# Patient Record
Sex: Female | Born: 1995 | Race: Asian | Hispanic: No | Marital: Single | Smoking: Never smoker
Health system: Southern US, Community
[De-identification: ages and names within clinical notes are randomized; demographics above are authoritative.]

## PROBLEM LIST (undated history)

## (undated) DIAGNOSIS — D649 Anemia, unspecified: Secondary | ICD-10-CM

## (undated) DIAGNOSIS — Z789 Other specified health status: Secondary | ICD-10-CM

## (undated) HISTORY — PX: NO PAST SURGERIES: SHX2092

---
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
## 2018-10-09 NOTE — L&D Delivery Note (Addendum)
Delivery Note At 8:44 AM a viable female was delivered via Vaginal, Vacuum (Extractor) (Presentation: ROA).  APGAR: 7, 9; weight pending   Placenta status: spontaneous and intact .  Cord: 3 vessel cord, cord was short. Anesthesia:  epidural Episiotomy:  Nil required  Lacerations: 2nd degree;Periurethral Suture Repair: 3.0 vicryl Est. Blood Loss (mL):  317  Mom to postpartum.  Baby to Couplet care / Skin to Skin.  Vacuum assisted delivery for fetal intolerance.   Lattie Haw MD PGY-1, [facility] 06/20/2019, 9:18 AM   OB/GYN Faculty Practice Delivery Note  Jacqueline Mitchell is a 23 y.o. G1P0 s/p VAVD at [redacted]w[redacted]d. She was admitted for SROM.   ROM: 31h 66m with clear fluid GBS Status:  --/Positive (08/13 0320) Maximum Maternal Temperature: 98.4 F    Labor Progress: . Patient arrived at 3 cm dilation and was induced with pitocin. She had intermittent late decelerations which required downtitration of pitocin. She achieved full dilation but after 30-40 min of pushing had recurrent prolonged decels and was then allowed to labor down for several hours. She was then found to be +3 station and resumed pushing with good effort but again had prolonged decels necessitating vacuum delivery.   Delivery Date/Time: 06/20/2019 at 0844 Delivery: Called to room and patient was complete and pushing but having prolonged decelerations. Dr. Ilda Basset called to the room, and after obtaining verbal consent Kiwi vacuum applied to infants head. Head delivered with one pull in ROA position. No nuchal cord present. Shoulder and body delivered in usual fashion. Infant with spontaneous cry, placed on mother's abdomen, dried and stimulated. Cord clamped x 2 after 1-minute delay, and cut by Dr. Freddi Starr. Cord blood drawn. Placenta delivered spontaneously with gentle cord traction. Fundus firm with massage and Pitocin. Labia, perineum, vagina, and cervix inspected with hemostatic bilateral periuretheral lacs that were  not repaired and a R shallow 2nd degree perineal and labial laceration that was repaired with two interrupted stitches.   Placenta: 3v, intact Complications: none Lacerations: bilateral periuretheral not repaired; R 2nd degree perineal and labial, repaired EBL: 317 Analgesia: epidural   Infant: APGAR (1 MIN): 7   APGAR (5 MINS): 9    Weight: pending  Augustin Coupe, MD/[facility] OB/GYN Fellow, Faculty Practice

## 2018-12-10 DIAGNOSIS — Z3400 Encounter for supervision of normal first pregnancy, unspecified trimester: Secondary | ICD-10-CM | POA: Insufficient documentation

## 2018-12-11 ENCOUNTER — Encounter: Payer: Self-pay | Admitting: Obstetrics and Gynecology

## 2018-12-26 ENCOUNTER — Other Ambulatory Visit: Payer: Self-pay

## 2018-12-26 ENCOUNTER — Other Ambulatory Visit (HOSPITAL_COMMUNITY)
Admission: RE | Admit: 2018-12-26 | Discharge: 2018-12-26 | Disposition: A | Discharge status: Home / Self Care | Payer: Medicaid Other | Source: Ambulatory Visit | Attending: Obstetrics and Gynecology | Admitting: Obstetrics and Gynecology

## 2018-12-26 ENCOUNTER — Encounter: Payer: Self-pay | Admitting: Certified Nurse Midwife

## 2018-12-26 ENCOUNTER — Ambulatory Visit (INDEPENDENT_AMBULATORY_CARE_PROVIDER_SITE_OTHER): Payer: Medicaid Other | Admitting: Certified Nurse Midwife

## 2018-12-26 VITALS — BP 107/66 | HR 98 | Ht 62.0 in | Wt 123.8 lb

## 2018-12-26 DIAGNOSIS — Z3400 Encounter for supervision of normal first pregnancy, unspecified trimester: Secondary | ICD-10-CM | POA: Insufficient documentation

## 2018-12-26 DIAGNOSIS — Z23 Encounter for immunization: Secondary | ICD-10-CM

## 2018-12-26 DIAGNOSIS — Z3481 Encounter for supervision of other normal pregnancy, first trimester: Secondary | ICD-10-CM | POA: Diagnosis not present

## 2018-12-26 DIAGNOSIS — Z3A20 20 weeks gestation of pregnancy: Secondary | ICD-10-CM

## 2018-12-26 DIAGNOSIS — O0932 Supervision of pregnancy with insufficient antenatal care, second trimester: Secondary | ICD-10-CM

## 2018-12-26 MED ORDER — VITAFOL GUMMIES 3.33-0.333-34.8 MG PO CHEW: 3.0000 | CHEWABLE_TABLET | Freq: Every day | ORAL | 3 refills | Status: DC

## 2018-12-26 NOTE — Progress Notes (Signed)
History:   Jacqueline Mitchell is a 23 y.o. G1P0 at [redacted]w[redacted]d by LMP being seen today for her first obstetrical visit.  Her obstetrical history is significant for late to prenatal care. Patient does intend to breast feed. Pregnancy history fully reviewed.  Patient reports no complaints.  HISTORY: OB History  Gravida Para Term Preterm AB Living  1 0 0 0 0 0  SAB TAB Ectopic Multiple Live Births  0 0 0 0 0    # Outcome Date GA Lbr Len/2nd Weight Sex Delivery Anes PTL Lv  1 Current            History reviewed. No pertinent past medical history. History reviewed. No pertinent surgical history. History reviewed. No pertinent family history. Social History   Tobacco Use  . Smoking status: Not on file  Substance Use Topics  . Alcohol use: Not on file  . Drug use: Not on file   No Known Allergies No current outpatient medications on file prior to visit.   No current facility-administered medications on file prior to visit.     Review of Systems Pertinent items noted in HPI and remainder of comprehensive ROS otherwise negative. Physical Exam:   Vitals:   12/26/18 1037 12/26/18 1043  BP: 107/66   Pulse: 98   Weight: 123 lb 12.8 oz (56.2 kg)   Height:  5\' 2"  (1.575 m)   Fetal Heart Rate (bpm): 156 Pelvic Exam: Perineum: no hemorrhoids, normal perineum   Vulva: normal external genitalia, no lesions   Bony Pelvis: average  System: General: well-developed, well-nourished female in no acute distress   Skin: normal coloration and turgor, no rashes   Neurologic: oriented, normal, negative, normal mood   Extremities: normal strength, tone, and muscle mass, ROM of all joints is normal   HEENT PERRLA, extraocular movement intact and sclera clear, anicteric   Mouth/Teeth mucous membranes moist, pharynx normal without lesions and dental hygiene good   Neck supple and no masses   Cardiovascular: regular rate and rhythm   Respiratory:  no respiratory distress, normal breath sounds   Abdomen:  soft, non-tender; bowel sounds normal; no masses,  no organomegaly   Assessment:    Pregnancy: G1P0 Patient Active Problem List   Diagnosis Date Noted  . Late prenatal care affecting pregnancy in second trimester 12/26/2018  . Supervision of normal first pregnancy 12/10/2018     Plan:    1. Supervision of normal first pregnancy, antepartum - Routine prenatal care  - Anticipatory guidance on upcoming appointments  - Reports family hx of diabetes  - Cervicovaginal ancillary only( [place]) - Genetic Screening - Culture, OB Urine - Obstetric Panel, Including HIV - AFP, Serum, Open Spina Bifida - [name] MFM OB COMP + 14 WK; Future - HgB A1c - Prenatal Vit-Fe Phos-FA-Omega (VITAFOL GUMMIES) 3.33-0.333-34.8 MG CHEW; Chew 3 tablets by mouth daily.  Dispense: 90 tablet; Refill: 3  2. Late prenatal care affecting pregnancy in second trimester - Care initiated after 20 weeks  - [name] MFM OB COMP + 14 WK; Future  3. Need for immunization against influenza - Flu Vaccine QUAD 36+ mos IM   Initial labs drawn. Rx for prenatal vitamins sent to pharmacy on file  Genetic Screening discussed, NIPS: ordered. Ultrasound discussed; fetal anatomic survey: ordered. Problem list reviewed and updated. The nature of [facility] with multiple MDs and other Advanced Practice Providers was explained to patient; also emphasized that residents, students are part of our team.  Routine obstetric precautions reviewed. Return in about 4 weeks (around 01/23/2019) for [name].     Sharyon Cable, CNM [facility] [employer], [facility]

## 2018-12-26 NOTE — Patient Instructions (Addendum)
Second Trimester of Pregnancy  The second trimester is from week 14 through week 27 (month 4 through 6). This is often the time in pregnancy that you feel your best. Often times, morning sickness has lessened or quit. You may have more energy, and you may get hungry more often. Your unborn baby is growing rapidly. At the end of the sixth month, he or she is about 9 inches long and weighs about 1 pounds. You will likely feel the baby move between 18 and 20 weeks of pregnancy. Follow these instructions at home: Medicines  Take over-the-counter and prescription medicines only as told by your doctor. Some medicines are safe and some medicines are not safe during pregnancy.  Take a prenatal vitamin that contains at least 600 micrograms (mcg) of folic acid.  If you have trouble pooping (constipation), take medicine that will make your stool soft (stool softener) if your doctor approves. Eating and drinking   Eat regular, healthy meals.  Avoid raw meat and uncooked cheese.  If you get low calcium from the food you eat, talk to your doctor about taking a daily calcium supplement.  Avoid foods that are high in fat and sugars, such as fried and sweet foods.  If you feel sick to your stomach (nauseous) or throw up (vomit): ? Eat 4 or 5 small meals a day instead of 3 large meals. ? Try eating a few soda crackers. ? Drink liquids between meals instead of during meals.  To prevent constipation: ? Eat foods that are high in fiber, like fresh fruits and vegetables, whole grains, and beans. ? Drink enough fluids to keep your pee (urine) clear or pale yellow. Activity  Exercise only as told by your doctor. Stop exercising if you start to have cramps.  Do not exercise if it is too hot, too humid, or if you are in a place of great height (high altitude).  Avoid heavy lifting.  Wear low-heeled shoes. Sit and stand up straight.  You can continue to have sex unless your doctor tells you not to.  Relieving pain and discomfort  Wear a good support bra if your breasts are tender.  Take warm water baths (sitz baths) to soothe pain or discomfort caused by hemorrhoids. Use hemorrhoid cream if your doctor approves.  Rest with your legs raised if you have leg cramps or low back pain.  If you develop puffy, bulging veins (varicose veins) in your legs: ? Wear support hose or compression stockings as told by your doctor. ? Raise (elevate) your feet for 15 minutes, 3-4 times a day. ? Limit salt in your food. Prenatal care  Write down your questions. Take them to your prenatal visits.  Keep all your prenatal visits as told by your doctor. This is important. Safety  Wear your seat belt when driving.  Make a list of emergency phone numbers, including numbers for family, friends, the hospital, and police and fire departments. General instructions  Ask your doctor about the right foods to eat or for help finding a counselor, if you need these services.  Ask your doctor about local prenatal classes. Begin classes before month 6 of your pregnancy.  Do not use hot tubs, steam rooms, or saunas.  Do not douche or use tampons or scented sanitary pads.  Do not cross your legs for long periods of time.  Visit your dentist if you have not done so. Use a soft toothbrush to brush your teeth. Floss gently.  Avoid all smoking, herbs,   and alcohol. Avoid drugs that are not approved by your doctor.  Do not use any products that contain nicotine or tobacco, such as cigarettes and e-cigarettes. If you need help quitting, ask your doctor.  Avoid cat litter boxes and soil used by cats. These carry germs that can cause birth defects in the baby and can cause a loss of your baby (miscarriage) or stillbirth. Contact a doctor if:  You have mild cramps or pressure in your lower belly.  You have pain when you pee (urinate).  You have bad smelling fluid coming from your vagina.  You continue to feel  sick to your stomach (nauseous), throw up (vomit), or have watery poop (diarrhea).  You have a nagging pain in your belly area.  You feel dizzy. Get help right away if:  You have a fever.  You are leaking fluid from your vagina.  You have spotting or bleeding from your vagina.  You have severe belly cramping or pain.  You lose or gain weight rapidly.  You have trouble catching your breath and have chest pain.  You notice sudden or extreme puffiness (swelling) of your face, hands, ankles, feet, or legs.  You have not felt the baby move in over an hour.  You have severe headaches that do not go away when you take medicine.  You have trouble seeing. Summary  The second trimester is from week 14 through week 27 (months 4 through 6). This is often the time in pregnancy that you feel your best.  To take care of yourself and your unborn baby, you will need to eat healthy meals, take medicines only if your doctor tells you to do so, and do activities that are safe for you and your baby.  Call your doctor if you get sick or if you notice anything unusual about your pregnancy. Also, call your doctor if you need help with the right food to eat, or if you want to know what activities are safe for you. This information is not intended to replace advice given to you by your health care provider. Make sure you discuss any questions you have with your health care provider. Document Released: 12/20/2009 Document Revised: 10/31/2016 Document Reviewed: 10/31/2016 Elsevier Interactive Patient Education  2019 [employer] Inc.  Influenza (Flu) Vaccine (Inactivated or Recombinant): What You Need to Know  1. Why get vaccinated? Influenza vaccine can prevent influenza (flu). Flu is a contagious disease that spreads around the [place] every year, usually between October and May. Anyone can get the flu, but it is more dangerous for some people. Infants and young children, people 41 years of age and  older, pregnant women, and people with certain health conditions or a weakened immune system are at greatest risk of flu complications. Pneumonia, bronchitis, sinus infections and ear infections are examples of flu-related complications. If you have a medical condition, such as heart disease, cancer or diabetes, flu can make it worse. Flu can cause fever and chills, sore throat, muscle aches, fatigue, cough, headache, and runny or stuffy nose. Some people may have vomiting and diarrhea, though this is more common in children than adults. Each year thousands of people in the [place] die from flu, and many more are hospitalized. Flu vaccine prevents millions of illnesses and flu-related visits to the doctor each year. 2. Influenza vaccine CDC recommends everyone 93 months of age and older get vaccinated every flu season. Children 6 months through 62 years of age may need 2 doses during  a single flu season. Everyone else needs only 1 dose each flu season. It takes about 2 weeks for protection to develop after vaccination. There are many flu viruses, and they are always changing. Each year a new flu vaccine is made to protect against three or four viruses that are likely to cause disease in the upcoming flu season. Even when the vaccine doesn't exactly match these viruses, it may still provide some protection. Influenza vaccine does not cause flu. Influenza vaccine may be given at the same time as other vaccines. 3. Talk with your health care provider Tell your vaccine provider if the person getting the vaccine:  Has had an allergic reaction after a previous dose of influenza vaccine, or has any severe, life-threatening allergies.  Has ever had Guillain-Barr Syndrome (also called GBS). In some cases, your health care provider may decide to postpone influenza vaccination to a future visit. People with minor illnesses, such as a cold, may be vaccinated. People who are moderately or severely ill  should usually wait until they recover before getting influenza vaccine. Your health care provider can give you more information. 4. Risks of a vaccine reaction  Soreness, redness, and swelling where shot is given, fever, muscle aches, and headache can happen after influenza vaccine.  There may be a very small increased risk of Guillain-Barr Syndrome (GBS) after inactivated influenza vaccine (the flu shot). Young children who get the flu shot along with pneumococcal vaccine (PCV13), and/or DTaP vaccine at the same time might be slightly more likely to have a seizure caused by fever. Tell your health care provider if a child who is getting flu vaccine has ever had a seizure. People sometimes faint after medical procedures, including vaccination. Tell your provider if you feel dizzy or have vision changes or ringing in the ears. As with any medicine, there is a very remote chance of a vaccine causing a severe allergic reaction, other serious injury, or death. 5. What if there is a serious problem? An allergic reaction could occur after the vaccinated person leaves the clinic. If you see signs of a severe allergic reaction (hives, swelling of the face and throat, difficulty breathing, a fast heartbeat, dizziness, or weakness), call 9-1-1 and get the person to the nearest hospital. For other signs that concern you, call your health care provider. Adverse reactions should be reported to the Vaccine Adverse Event Reporting System (VAERS). Your health care provider will usually file this report, or you can do it yourself. Visit the VAERS website at www.vaers.LAgents.no or call 878-578-4967.VAERS is only for reporting reactions, and VAERS staff do not give medical advice. 6. The National Vaccine Injury Compensation Program The [employer] Vaccine Injury Compensation Program (VICP) is a federal program that was created to compensate people who may have been injured by certain vaccines. Visit the VICP website at  SpiritualWord.at or call 201-485-1299 to learn about the program and about filing a claim. There is a time limit to file a claim for compensation. 7. How can I learn more?  Ask your healthcare provider.  Call your local or state health department.  Contact the Centers for Disease Control and Prevention (CDC): ? Call 351-153-2179 (1-800-CDC-INFO) or ? Visit CDC's BiotechRoom.com.cy Vaccine Information Statement (Interim) Inactivated Influenza Vaccine (05/23/2018) This information is not intended to replace advice given to you by your health care provider. Make sure you discuss any questions you have with your health care provider. Document Released: 07/20/2006 Document Revised: 05/27/2018 Document Reviewed: 05/27/2018 Elsevier Interactive Patient Education  2019 [place].

## 2018-12-28 LAB — OBSTETRIC PANEL, INCLUDING HIV
Antibody Screen: NEGATIVE
Basophils Absolute: 0 10*3/uL (ref 0.0–0.2)
Basos: 0 %
EOS (ABSOLUTE): 0.1 10*3/uL (ref 0.0–0.4)
Eos: 1 %
HIV Screen 4th Generation wRfx: NONREACTIVE
Hematocrit: 31.6 % — ABNORMAL LOW (ref 34.0–46.6)
Hemoglobin: 10.3 g/dL — ABNORMAL LOW (ref 11.1–15.9)
Hepatitis B Surface Ag: NEGATIVE
Immature Grans (Abs): 0.1 10*3/uL (ref 0.0–0.1)
Immature Granulocytes: 1 %
Lymphocytes Absolute: 1.6 10*3/uL (ref 0.7–3.1)
Lymphs: 12 %
MCH: 28.6 pg (ref 26.6–33.0)
MCHC: 32.6 g/dL (ref 31.5–35.7)
MCV: 88 fL (ref 79–97)
Monocytes Absolute: 0.9 10*3/uL (ref 0.1–0.9)
Monocytes: 6 %
Neutrophils Absolute: 10.9 10*3/uL — ABNORMAL HIGH (ref 1.4–7.0)
Neutrophils: 80 %
Platelets: 284 10*3/uL (ref 150–450)
RBC: 3.6 x10E6/uL — ABNORMAL LOW (ref 3.77–5.28)
RDW: 15.3 % (ref 11.7–15.4)
RPR Ser Ql: NONREACTIVE
Rh Factor: POSITIVE
Rubella Antibodies, IGG: 5.36 index (ref >0.99)
WBC: 13.7 10*3/uL — ABNORMAL HIGH (ref 3.4–10.8)

## 2018-12-28 LAB — AFP, SERUM, OPEN SPINA BIFIDA
AFP MoM: 0.59
AFP Value: 40.4 ng/mL
Gest. Age on Collection Date: 20.5 weeks
Maternal Age At EDD: 21.1 yr
OSBR Risk 1 IN: 10000
Test Results:: NEGATIVE
Weight: 123 [lb_av]

## 2018-12-28 LAB — CERVICOVAGINAL ANCILLARY ONLY
Chlamydia: NEGATIVE
Neisseria Gonorrhea: NEGATIVE
Trichomonas: NEGATIVE

## 2018-12-28 LAB — HEMOGLOBIN A1C
Est. average glucose Bld gHb Est-mCnc: 103 mg/dL
Hgb A1c MFr Bld: 5.2 % (ref 4.8–5.6)

## 2018-12-29 LAB — CULTURE, OB URINE

## 2018-12-29 LAB — URINE CULTURE, OB REFLEX

## 2018-12-30 ENCOUNTER — Ambulatory Visit (HOSPITAL_COMMUNITY)
Admission: RE | Admit: 2018-12-30 | Discharge: 2018-12-30 | Disposition: A | Discharge status: Home / Self Care | Payer: Medicaid Other | Source: Ambulatory Visit | Attending: Certified Nurse Midwife | Admitting: Certified Nurse Midwife

## 2018-12-30 ENCOUNTER — Other Ambulatory Visit: Payer: Self-pay

## 2018-12-30 DIAGNOSIS — Z3400 Encounter for supervision of normal first pregnancy, unspecified trimester: Secondary | ICD-10-CM

## 2018-12-30 DIAGNOSIS — O26842 Uterine size-date discrepancy, second trimester: Secondary | ICD-10-CM | POA: Diagnosis not present

## 2018-12-30 DIAGNOSIS — Z3A16 16 weeks gestation of pregnancy: Secondary | ICD-10-CM

## 2018-12-30 DIAGNOSIS — Z363 Encounter for antenatal screening for malformations: Secondary | ICD-10-CM

## 2018-12-30 DIAGNOSIS — O0932 Supervision of pregnancy with insufficient antenatal care, second trimester: Secondary | ICD-10-CM | POA: Diagnosis not present

## 2019-01-06 ENCOUNTER — Encounter: Payer: Self-pay | Admitting: Certified Nurse Midwife

## 2019-01-09 ENCOUNTER — Encounter: Payer: Medicaid Other | Admitting: Certified Nurse Midwife

## 2019-01-21 ENCOUNTER — Other Ambulatory Visit (HOSPITAL_COMMUNITY): Payer: Self-pay | Admitting: *Deleted

## 2019-01-21 DIAGNOSIS — Z362 Encounter for other antenatal screening follow-up: Secondary | ICD-10-CM

## 2019-01-23 ENCOUNTER — Other Ambulatory Visit: Payer: Self-pay

## 2019-01-23 ENCOUNTER — Ambulatory Visit (INDEPENDENT_AMBULATORY_CARE_PROVIDER_SITE_OTHER): Payer: Medicaid Other | Admitting: Family Medicine

## 2019-01-23 DIAGNOSIS — Z3402 Encounter for supervision of normal first pregnancy, second trimester: Secondary | ICD-10-CM | POA: Diagnosis not present

## 2019-01-23 DIAGNOSIS — Z3A19 19 weeks gestation of pregnancy: Secondary | ICD-10-CM

## 2019-01-23 NOTE — Progress Notes (Signed)
   TELEHEALTH VIRTUAL OBSTETRICS VISIT ENCOUNTER NOTE  I connected with Jacqueline Mitchell on 01/23/19 at 10:55 AM EDT by telephone at home and verified that I am speaking with the correct person using two identifiers.   I discussed the limitations, risks, security and privacy concerns of performing an evaluation and management service by telephone and the availability of in person appointments. I also discussed with the patient that there may be a patient responsible charge related to this service. The patient expressed understanding and agreed to proceed.  Subjective:  Jacqueline Mitchell is a 23 y.o. G1P0 at [redacted]w[redacted]d being followed for ongoing prenatal care.  She is currently monitored for the following issues for this low-risk pregnancy and has Supervision of normal first pregnancy and Late prenatal care affecting pregnancy in second trimester on their problem list.  Patient reports LLQ abdominal pain with movement. Reports fetal movement. Denies any contractions, bleeding or leaking of fluid.   The following portions of the patient's history were reviewed and updated as appropriate: allergies, current medications, past family history, past medical history, past social history, past surgical history and problem list.   Objective:   General:  Alert, oriented and cooperative.   Mental Status: Normal mood and affect perceived. Normal judgment and thought content.  Rest of physical exam deferred due to type of encounter  Assessment and Plan:  Pregnancy: G1P0 at [redacted]w[redacted]d 1. Encounter for supervision of normal first pregnancy in second trimester Normal Round ligament pain Anatomy f/u scheduled for 1st week of May Advised she should start to feel baby move in next 1-2 wks - Babyscripts Schedule Optimization  General obstetric precautions including but not limited to vaginal bleeding, contractions, leaking of fluid and fetal movement were reviewed in detail with the patient.  I discussed the assessment and treatment  plan with the patient. The patient was provided an opportunity to ask questions and all were answered. The patient agreed with the plan and demonstrated an understanding of the instructions. The patient was advised to call back or seek an in-person office evaluation/go to MAU at [facility] for any urgent or concerning symptoms. Please refer to After Visit Summary for other counseling recommendations.   I provided 6 minutes of non-face-to-face time during this encounter.  Return in about 4 weeks (around 02/20/2019) for [facility] virtual, next in person in 8 wks with 28 wk labs.  Future Appointments  Date Time Provider Department Center  02/10/2019 11:30 AM WH-MFC [name] 1 WH-MFCUS MFC-US    Reva Bores, MD Center for [facility], [facility]

## 2019-02-10 ENCOUNTER — Other Ambulatory Visit: Payer: Self-pay

## 2019-02-10 ENCOUNTER — Ambulatory Visit (HOSPITAL_COMMUNITY)
Admission: RE | Admit: 2019-02-10 | Discharge: 2019-02-10 | Disposition: A | Discharge status: Home / Self Care | Payer: Medicaid Other | Source: Ambulatory Visit | Attending: Obstetrics and Gynecology | Admitting: Obstetrics and Gynecology

## 2019-02-10 DIAGNOSIS — O0932 Supervision of pregnancy with insufficient antenatal care, second trimester: Secondary | ICD-10-CM | POA: Diagnosis not present

## 2019-02-10 DIAGNOSIS — Z3A22 22 weeks gestation of pregnancy: Secondary | ICD-10-CM

## 2019-02-10 DIAGNOSIS — Z362 Encounter for other antenatal screening follow-up: Secondary | ICD-10-CM

## 2019-02-20 ENCOUNTER — Encounter: Payer: Medicaid Other | Admitting: Obstetrics

## 2019-02-26 ENCOUNTER — Telehealth: Payer: Self-pay | Admitting: Obstetrics

## 2019-03-05 ENCOUNTER — Other Ambulatory Visit: Payer: Self-pay

## 2019-03-05 ENCOUNTER — Encounter: Payer: Self-pay | Admitting: Obstetrics

## 2019-03-05 ENCOUNTER — Ambulatory Visit (INDEPENDENT_AMBULATORY_CARE_PROVIDER_SITE_OTHER): Payer: Medicaid Other | Admitting: Obstetrics

## 2019-03-05 VITALS — BP 107/58 | HR 103

## 2019-03-05 DIAGNOSIS — Z3A25 25 weeks gestation of pregnancy: Secondary | ICD-10-CM | POA: Diagnosis not present

## 2019-03-05 DIAGNOSIS — Z3402 Encounter for supervision of normal first pregnancy, second trimester: Secondary | ICD-10-CM | POA: Diagnosis not present

## 2019-03-05 DIAGNOSIS — Z3400 Encounter for supervision of normal first pregnancy, unspecified trimester: Secondary | ICD-10-CM

## 2019-03-05 NOTE — Progress Notes (Signed)
   TELEHEALTH VIRTUAL OBSTETRICS VISIT ENCOUNTER NOTE  I connected with Jacqueline Mitchell on 03/05/19 at 10:00 AM EDT by telephone at home and verified that I am speaking with the correct person using two identifiers.   I discussed the limitations, risks, security and privacy concerns of performing an evaluation and management service by telephone and the availability of in person appointments. I also discussed with the patient that there may be a patient responsible charge related to this service. The patient expressed understanding and agreed to proceed.  Subjective:  Jacqueline Mitchell is a 23 y.o. G1P0 at [redacted]w[redacted]d being followed for ongoing prenatal care.  She is currently monitored for the following issues for this low-risk pregnancy and has Supervision of normal first pregnancy and Late prenatal care affecting pregnancy in second trimester on their problem list.  Patient reports backache and heartburn. Reports fetal movement. Denies any contractions, bleeding or leaking of fluid.   The following portions of the patient's history were reviewed and updated as appropriate: allergies, current medications, past family history, past medical history, past social history, past surgical history and problem list.   Objective:   General:  Alert, oriented and cooperative.   Mental Status: Normal mood and affect perceived. Normal judgment and thought content.  Rest of physical exam deferred due to type of encounter  Assessment and Plan:  Pregnancy: G1P0 at [redacted]w[redacted]d 1. Supervision of normal first pregnancy, antepartum   Preterm labor symptoms and general obstetric precautions including but not limited to vaginal bleeding, contractions, leaking of fluid and fetal movement were reviewed in detail with the patient.  I discussed the assessment and treatment plan with the patient. The patient was provided an opportunity to ask questions and all were answered. The patient agreed with the plan and demonstrated an understanding of  the instructions. The patient was advised to call back or seek an in-person office evaluation/go to MAU at [facility] for any urgent or concerning symptoms. Please refer to After Visit Summary for other counseling recommendations.   I provided 10 minutes of non-face-to-face time during this encounter.  Return in about 3 weeks (around 03/26/2019) for [name], 2 hour OGTT.  Future Appointments  Date Time Provider Department Center  03/05/2019 10:00 AM Brock Bad, MD [facility] None  03/20/2019  8:30 AM [facility] LAB [facility] None  03/20/2019  8:55 AM Calvert Cantor, CNM [facility] None    Coral Ceo, MD Center for [facility], [facility] 03-05-2019

## 2019-03-05 NOTE — Progress Notes (Signed)
Pt is on the phone preparing for virtual visit with provider. [redacted]w[redacted]d.

## 2019-03-20 ENCOUNTER — Ambulatory Visit (INDEPENDENT_AMBULATORY_CARE_PROVIDER_SITE_OTHER): Payer: Medicaid Other | Admitting: Advanced Practice Midwife

## 2019-03-20 ENCOUNTER — Encounter: Payer: Self-pay | Admitting: Advanced Practice Midwife

## 2019-03-20 ENCOUNTER — Other Ambulatory Visit: Payer: Self-pay

## 2019-03-20 ENCOUNTER — Other Ambulatory Visit: Payer: Medicaid Other

## 2019-03-20 VITALS — BP 107/67 | HR 98 | Wt 138.6 lb

## 2019-03-20 DIAGNOSIS — Z23 Encounter for immunization: Secondary | ICD-10-CM | POA: Diagnosis not present

## 2019-03-20 DIAGNOSIS — Z3400 Encounter for supervision of normal first pregnancy, unspecified trimester: Secondary | ICD-10-CM

## 2019-03-20 DIAGNOSIS — Z3402 Encounter for supervision of normal first pregnancy, second trimester: Secondary | ICD-10-CM

## 2019-03-20 DIAGNOSIS — Z3A27 27 weeks gestation of pregnancy: Secondary | ICD-10-CM

## 2019-03-20 NOTE — Progress Notes (Signed)
   PRENATAL VISIT NOTE  Subjective:  Jacqueline Mitchell is a 23 y.o. G1P0 at [redacted]w[redacted]d being seen today for ongoing prenatal care.  She is currently monitored for the following issues for this low-risk pregnancy and has Supervision of normal first pregnancy and Late prenatal care affecting pregnancy in second trimester on their problem list.  Patient reports no complaints.  Contractions: Not present. Vag. Bleeding: None.  Movement: Present. Denies leaking of fluid.   The following portions of the patient's history were reviewed and updated as appropriate: allergies, current medications, past family history, past medical history, past social history, past surgical history and problem list. Problem list updated.  Objective:   Vitals:   03/20/19 0847  BP: 107/67  Pulse: 98  Weight: 138 lb 9.6 oz (62.9 kg)    Fetal Status: Fetal Heart Rate (bpm): 150   Movement: Present   FH 27cm  General:  Alert, oriented and cooperative. Patient is in no acute distress.  Skin: Skin is warm and dry. No rash noted.   Cardiovascular: Normal heart rate noted  Respiratory: Normal respiratory effort, no problems with respiration noted  Abdomen: Soft, gravid, appropriate for gestational age.  Pain/Pressure: Present     Pelvic: Cervical exam deferred        Extremities: Normal range of motion.  Edema: None  Mental Status: Normal mood and affect. Normal behavior. Normal judgment and thought content.   Assessment and Plan:  Pregnancy: G1P0 at [redacted]w[redacted]d  1. Supervision of normal first pregnancy, antepartum --No complaints or concerns, continue routine care --No home blood pressures in Baby Scripts since 05/20. Instructed patient to please obtain weekly --Reviewed third trimester typical symptoms, precautions --Discussed kick counts beginning at 28 weeks --Ensured patient is aware of location of MAU and symptoms which would merit evaluation there --TDAP today, problem list updated  Preterm labor symptoms and general  obstetric precautions including but not limited to vaginal bleeding, contractions, leaking of fluid and fetal movement were reviewed in detail with the patient.  Please refer to After Visit Summary for other counseling recommendations.  Return in about 4 weeks (around 04/17/2019) for televisit with any provider.  Future Appointments  Date Time Provider [place]  04/17/2019  9:00 AM [place], [place] None    Darlina Rumpf, [place]

## 2019-03-20 NOTE — Patient Instructions (Addendum)
Third Trimester of Pregnancy    The third trimester is from week 28 through week 40 (months 7 through 9). This trimester is when your unborn baby (fetus) is growing very fast. At the end of the ninth month, the unborn baby is about 20 inches in length. It weighs about 6-10 pounds.  Follow these instructions at home:  Medicines   Take over-the-counter and prescription medicines only as told by your doctor. Some medicines are safe and some medicines are not safe during pregnancy.   Take a prenatal vitamin that contains at least 600 micrograms (mcg) of folic acid.   If you have trouble pooping (constipation), take medicine that will make your stool soft (stool softener) if your doctor approves.  Eating and drinking     Eat regular, healthy meals.   Avoid raw meat and uncooked cheese.   If you get low calcium from the food you eat, talk to your doctor about taking a daily calcium supplement.   Eat four or five small meals rather than three large meals a day.   Avoid foods that are high in fat and sugars, such as fried and sweet foods.   To prevent constipation:  ? Eat foods that are high in fiber, like fresh fruits and vegetables, whole grains, and beans.  ? Drink enough fluids to keep your pee (urine) clear or pale yellow.  Activity   Exercise only as told by your doctor. Stop exercising if you start to have cramps.   Avoid heavy lifting, wear low heels, and sit up straight.   Do not exercise if it is too hot, too humid, or if you are in a place of great height (high altitude).   You may continue to have sex unless your doctor tells you not to.  Relieving pain and discomfort   Wear a good support bra if your breasts are tender.   Take frequent breaks and rest with your legs raised if you have leg cramps or low back pain.   Take warm water baths (sitz baths) to soothe pain or discomfort caused by hemorrhoids. Use hemorrhoid cream if your doctor approves.   If you develop puffy, bulging veins (varicose  veins) in your legs:  ? Wear support hose or compression stockings as told by your doctor.  ? Raise (elevate) your feet for 15 minutes, 3-4 times a day.  ? Limit salt in your food.  Safety   Wear your seat belt when driving.   Make a list of emergency phone numbers, including numbers for family, friends, the hospital, and police and fire departments.  Preparing for your baby's arrival  To prepare for the arrival of your baby:   Take prenatal classes.   Practice driving to the hospital.   Visit the hospital and tour the maternity area.   Talk to your work about taking leave once the baby comes.   Pack your hospital bag.   Prepare the baby's room.   Go to your doctor visits.   Buy a rear-facing car seat. Learn how to install it in your car.  General instructions   Do not use hot tubs, steam rooms, or saunas.   Do not use any products that contain nicotine or tobacco, such as cigarettes and e-cigarettes. If you need help quitting, ask your doctor.   Do not drink alcohol.   Do not douche or use tampons or scented sanitary pads.   Do not cross your legs for long periods of time.   Do   not travel for long distances unless you must. Only do so if your doctor says it is okay.   Visit your dentist if you have not gone during your pregnancy. Use a soft toothbrush to brush your teeth. Be gentle when you floss.   Avoid cat litter boxes and soil used by cats. These carry germs that can cause birth defects in the baby and can cause a loss of your baby (miscarriage) or stillbirth.   Keep all your prenatal visits as told by your doctor. This is important.  Contact a doctor if:   You are not sure if you are in labor or if your water has broken.   You are dizzy.   You have mild cramps or pressure in your lower belly.   You have a nagging pain in your belly area.   You continue to feel sick to your stomach, you throw up, or you have watery poop.   You have bad smelling fluid coming from your vagina.   You have  pain when you pee.  Get help right away if:   You have a fever.   You are leaking fluid from your vagina.   You are spotting or bleeding from your vagina.   You have severe belly cramps or pain.   You lose or gain weight quickly.   You have trouble catching your breath and have chest pain.   You notice sudden or extreme puffiness (swelling) of your face, hands, ankles, feet, or legs.   You have not felt the baby move in over an hour.   You have severe headaches that do not go away with medicine.   You have trouble seeing.   You are leaking, or you are having a gush of fluid, from your vagina before you are 37 weeks.   You have regular belly spasms (contractions) before you are 37 weeks.  Summary   The third trimester is from week 28 through week 40 (months 7 through 9). This time is when your unborn baby is growing very fast.   Follow your doctor's advice about medicine, food, and activity.   Get ready for the arrival of your baby by taking prenatal classes, getting all the baby items ready, preparing the baby's room, and visiting your doctor to be checked.   Get help right away if you are bleeding from your vagina, or you have chest pain and trouble catching your breath, or if you have not felt your baby move in over an hour.  This information is not intended to replace advice given to you by your health care provider. Make sure you discuss any questions you have with your health care provider.  Document Released: 12/20/2009 Document Revised: 10/31/2016 Document Reviewed: 10/31/2016  Elsevier Interactive Patient Education  2019 Elsevier Inc.  Fetal Movement Counts  Patient Name: ________________________________________________ Patient Due Date: ____________________  What is a fetal movement count?    A fetal movement count is the number of times that you feel your baby move during a certain amount of time. This may also be called a fetal kick count. A fetal movement count is recommended for every  pregnant woman. You may be asked to start counting fetal movements as early as week 28 of your pregnancy.  Pay attention to when your baby is most active. You may notice your baby's sleep and wake cycles. You may also notice things that make your baby move more. You should do a fetal movement count:   When your   baby is normally most active.   At the same time each day.  A good time to count movements is while you are resting, after having something to eat and drink.  How do I count fetal movements?  1. Find a quiet, comfortable area. Sit, or lie down on your side.  2. Write down the date, the start time and stop time, and the number of movements that you felt between those two times. Take this information with you to your health care visits.  3. For 2 hours, count kicks, flutters, swishes, rolls, and jabs. You should feel at least 10 movements during 2 hours.  4. You may stop counting after you have felt 10 movements.  5. If you do not feel 10 movements in 2 hours, have something to eat and drink. Then, keep resting and counting for 1 hour. If you feel at least 4 movements during that hour, you may stop counting.  Contact a health care provider if:   You feel fewer than 4 movements in 2 hours.   Your baby is not moving like he or she usually does.  Date: ____________ Start time: ____________ Stop time: ____________ Movements: ____________  Date: ____________ Start time: ____________ Stop time: ____________ Movements: ____________  Date: ____________ Start time: ____________ Stop time: ____________ Movements: ____________  Date: ____________ Start time: ____________ Stop time: ____________ Movements: ____________  Date: ____________ Start time: ____________ Stop time: ____________ Movements: ____________  Date: ____________ Start time: ____________ Stop time: ____________ Movements: ____________  Date: ____________ Start time: ____________ Stop time: ____________ Movements: ____________  Date: ____________ Start  time: ____________ Stop time: ____________ Movements: ____________  Date: ____________ Start time: ____________ Stop time: ____________ Movements: ____________  This information is not intended to replace advice given to you by your health care provider. Make sure you discuss any questions you have with your health care provider.  Document Released: 10/25/2006 Document Revised: 05/24/2016 Document Reviewed: 11/04/2015  Elsevier Interactive Patient Education  2019 Elsevier Inc.

## 2019-03-20 NOTE — Progress Notes (Signed)
Pt presents for [name]/2 gtt labs/Tdap No concerns today per pt.

## 2019-03-21 LAB — CBC
Hematocrit: 31.9 % — ABNORMAL LOW (ref 34.0–46.6)
Hemoglobin: 10.2 g/dL — ABNORMAL LOW (ref 11.1–15.9)
MCH: 28 pg (ref 26.6–33.0)
MCHC: 32 g/dL (ref 31.5–35.7)
MCV: 88 fL (ref 79–97)
Platelets: 319 10*3/uL (ref 150–450)
RBC: 3.64 x10E6/uL — ABNORMAL LOW (ref 3.77–5.28)
RDW: 12.3 % (ref 11.7–15.4)
WBC: 14.1 10*3/uL — ABNORMAL HIGH (ref 3.4–10.8)

## 2019-03-21 LAB — GLUCOSE TOLERANCE, 2 HOURS W/ 1HR
Glucose, 1 hour: 101 mg/dL (ref 65–179)
Glucose, 2 hour: 98 mg/dL (ref 65–152)
Glucose, Fasting: 63 mg/dL — ABNORMAL LOW (ref 65–91)

## 2019-03-21 LAB — HIV ANTIBODY (ROUTINE TESTING W REFLEX): HIV Screen 4th Generation wRfx: NONREACTIVE

## 2019-03-21 LAB — RPR: RPR Ser Ql: NONREACTIVE

## 2019-04-17 ENCOUNTER — Telehealth: Payer: Self-pay | Admitting: Obstetrics & Gynecology

## 2019-04-17 ENCOUNTER — Encounter: Payer: Self-pay | Admitting: Obstetrics & Gynecology

## 2019-04-17 ENCOUNTER — Other Ambulatory Visit: Payer: Self-pay

## 2019-04-17 ENCOUNTER — Ambulatory Visit (INDEPENDENT_AMBULATORY_CARE_PROVIDER_SITE_OTHER): Payer: Medicaid Other | Admitting: Obstetrics & Gynecology

## 2019-04-17 VITALS — BP 91/51 | HR 81

## 2019-04-17 DIAGNOSIS — O99613 Diseases of the digestive system complicating pregnancy, third trimester: Secondary | ICD-10-CM

## 2019-04-17 DIAGNOSIS — Z3403 Encounter for supervision of normal first pregnancy, third trimester: Secondary | ICD-10-CM

## 2019-04-17 DIAGNOSIS — K219 Gastro-esophageal reflux disease without esophagitis: Secondary | ICD-10-CM | POA: Insufficient documentation

## 2019-04-17 DIAGNOSIS — O0933 Supervision of pregnancy with insufficient antenatal care, third trimester: Secondary | ICD-10-CM

## 2019-04-17 DIAGNOSIS — O99619 Diseases of the digestive system complicating pregnancy, unspecified trimester: Secondary | ICD-10-CM

## 2019-04-17 DIAGNOSIS — O0932 Supervision of pregnancy with insufficient antenatal care, second trimester: Secondary | ICD-10-CM

## 2019-04-17 DIAGNOSIS — Z3400 Encounter for supervision of normal first pregnancy, unspecified trimester: Secondary | ICD-10-CM

## 2019-04-17 DIAGNOSIS — Z3A31 31 weeks gestation of pregnancy: Secondary | ICD-10-CM

## 2019-04-17 NOTE — Progress Notes (Signed)
   [place] VIRTUAL VIDEO VISIT ENCOUNTER NOTE  Provider location: Center for [place] at [place]   I connected with Jacqueline Mitchell on 04/17/19 at  9:00 AM EDT by WebEx  Video Encounter at home and verified that I am speaking with the correct person using two identifiers.   I discussed the limitations, risks, security and privacy concerns of performing an evaluation and management service virtually and the availability of in person appointments. I also discussed with the patient that there may be a patient responsible charge related to this service. The patient expressed understanding and agreed to proceed. Subjective:  Jacqueline Mitchell is a 23 y.o. G1P0 at [redacted]w[redacted]d being seen today for ongoing prenatal care.  She is currently monitored for the following issues for this low-risk pregnancy and has Supervision of normal first pregnancy and Late prenatal care affecting pregnancy in second trimester on their problem list.  Patient reports no complaints.  Contractions: Not present. Vag. Bleeding: None.  Movement: Present. Denies any leaking of fluid.   The following portions of the patient's history were reviewed and updated as appropriate: allergies, current medications, past family history, past medical history, past social history, past surgical history and problem list.   Objective:   Vitals:   04/17/19 0823  BP: (!) 91/51  Pulse: 81    Fetal Status:     Movement: Present     General:  Alert, oriented and cooperative. Patient is in no acute distress.  Respiratory: Normal respiratory effort, no problems with respiration noted  Mental Status: Normal mood and affect. Normal behavior. Normal judgment and thought content.  Rest of physical exam deferred due to type of encounter  Imaging: No results found.  Assessment and Plan:  Pregnancy: G1P0 at [redacted]w[redacted]d 1. Supervision of normal first pregnancy, antepartum BP this am 91/51  2. Late prenatal care affecting pregnancy in second  trimester  3. Heartburn  Rec Tums or Rolaids.  Prilosec OTC if that fails.    Preterm labor symptoms and general obstetric precautions including but not limited to vaginal bleeding, contractions, leaking of fluid and fetal movement were reviewed in detail with the patient. I discussed the assessment and treatment plan with the patient. The patient was provided an opportunity to ask questions and all were answered. The patient agreed with the plan and demonstrated an understanding of the instructions. The patient was advised to call back or seek an in-person office evaluation/go to MAU at [facility] for any urgent or concerning symptoms. Please refer to After Visit Summary for other counseling recommendations.   I provided 15 minutes of face-to-face time during this encounter.  No follow-ups on file.  No future appointments.  Lavonia Drafts, MD Center for [facility], [place]

## 2019-04-17 NOTE — Telephone Encounter (Signed)
This was a web based visit. See notes.   [id number]

## 2019-04-17 NOTE — Progress Notes (Signed)
I connected with  Jacqueline Mitchell on 04/17/19 by a video enabled telemedicine application and verified that I am speaking with the correct person using two identifiers.   [name]. Reports no problems today.

## 2019-05-08 ENCOUNTER — Ambulatory Visit (INDEPENDENT_AMBULATORY_CARE_PROVIDER_SITE_OTHER): Payer: Medicaid Other | Admitting: Family Medicine

## 2019-05-08 DIAGNOSIS — Z3403 Encounter for supervision of normal first pregnancy, third trimester: Secondary | ICD-10-CM

## 2019-05-08 DIAGNOSIS — Z3A34 34 weeks gestation of pregnancy: Secondary | ICD-10-CM

## 2019-05-08 DIAGNOSIS — Z3400 Encounter for supervision of normal first pregnancy, unspecified trimester: Secondary | ICD-10-CM

## 2019-05-08 NOTE — Patient Instructions (Signed)

## 2019-05-08 NOTE — Progress Notes (Signed)
I connected with  Jacqueline Mitchell on 05/08/19 by a video enabled telemedicine application and verified that I am speaking with the correct person using two identifiers.  WebEx OB, reports no problems today.

## 2019-05-08 NOTE — Progress Notes (Signed)
   [place] VIRTUAL VIDEO VISIT ENCOUNTER NOTE  Provider location: Center for [place] at [place]   I connected with Jacqueline Mitchell on 05/08/19 at 10:30 AM EDT by WebEx Encounter at home and verified that I am speaking with the correct person using two identifiers.   I discussed the limitations, risks, security and privacy concerns of performing an evaluation and management service virtually and the availability of in person appointments. I also discussed with the patient that there may be a patient responsible charge related to this service. The patient expressed understanding and agreed to proceed. Subjective:  Jacqueline Mitchell is a 23 y.o. G1P0 at [redacted]w[redacted]d being seen today for ongoing prenatal care.  She is currently monitored for the following issues for this low-risk pregnancy and has Supervision of normal first pregnancy; Late prenatal care affecting pregnancy in second trimester; and Gastroesophageal reflux during pregnancy, antepartum on their problem list.  Patient reports no complaints.  Contractions: Irritability. Vag. Bleeding: None.  Movement: Present. Denies any leaking of fluid.   The following portions of the patient's history were reviewed and updated as appropriate: allergies, current medications, past family history, past medical history, past social history, past surgical history and problem list.   Objective:   Vitals:   05/08/19 1042  BP: 104/67  Pulse: 90    Fetal Status:     Movement: Present     General:  Alert, oriented and cooperative. Patient is in no acute distress.  Respiratory: Normal respiratory effort, no problems with respiration noted  Mental Status: Normal mood and affect. Normal behavior. Normal judgment and thought content.  Rest of physical exam deferred due to type of encounter  Imaging: No results found.  Assessment and Plan:  Pregnancy: G1P0 at [redacted]w[redacted]d 1. Supervision of normal first pregnancy, antepartum Continue routine prenatal  care.   Preterm labor symptoms and general obstetric precautions including but not limited to vaginal bleeding, contractions, leaking of fluid and fetal movement were reviewed in detail with the patient. I discussed the assessment and treatment plan with the patient. The patient was provided an opportunity to ask questions and all were answered. The patient agreed with the plan and demonstrated an understanding of the instructions. The patient was advised to call back or seek an in-person office evaluation/go to MAU at [facility] for any urgent or concerning symptoms. Please refer to After Visit Summary for other counseling recommendations.   I provided 8 minutes of face-to-face time during this encounter.  Return in 2 weeks (on 05/22/2019) for in person, [facility].  Future Appointments  Date Time Provider [place]  05/22/2019  2:15 PM Lajean Manes, CNM [facility] None    Donnamae Jude, MD Center for [employer], [place]

## 2019-05-22 ENCOUNTER — Other Ambulatory Visit (HOSPITAL_COMMUNITY)
Admission: RE | Admit: 2019-05-22 | Discharge: 2019-05-22 | Disposition: A | Discharge status: Home / Self Care | Payer: Medicaid Other | Source: Ambulatory Visit | Attending: Advanced Practice Midwife | Admitting: Advanced Practice Midwife

## 2019-05-22 ENCOUNTER — Ambulatory Visit (INDEPENDENT_AMBULATORY_CARE_PROVIDER_SITE_OTHER): Payer: Medicaid Other | Admitting: Advanced Practice Midwife

## 2019-05-22 ENCOUNTER — Other Ambulatory Visit: Payer: Self-pay

## 2019-05-22 ENCOUNTER — Encounter: Payer: Self-pay | Admitting: Advanced Practice Midwife

## 2019-05-22 VITALS — BP 119/75 | HR 72 | Wt 147.8 lb

## 2019-05-22 DIAGNOSIS — Z3403 Encounter for supervision of normal first pregnancy, third trimester: Secondary | ICD-10-CM | POA: Diagnosis present

## 2019-05-22 DIAGNOSIS — O26893 Other specified pregnancy related conditions, third trimester: Secondary | ICD-10-CM

## 2019-05-22 DIAGNOSIS — O26899 Other specified pregnancy related conditions, unspecified trimester: Secondary | ICD-10-CM

## 2019-05-22 DIAGNOSIS — R110 Nausea: Secondary | ICD-10-CM

## 2019-05-22 DIAGNOSIS — O0933 Supervision of pregnancy with insufficient antenatal care, third trimester: Secondary | ICD-10-CM

## 2019-05-22 DIAGNOSIS — O0932 Supervision of pregnancy with insufficient antenatal care, second trimester: Secondary | ICD-10-CM

## 2019-05-22 DIAGNOSIS — Z3A36 36 weeks gestation of pregnancy: Secondary | ICD-10-CM

## 2019-05-22 DIAGNOSIS — R51 Headache: Secondary | ICD-10-CM

## 2019-05-22 MED ORDER — ONDANSETRON HCL 4 MG PO TABS: 4.0000 mg | ORAL_TABLET | Freq: Three times a day (TID) | ORAL | 0 refills | Status: DC | PRN

## 2019-05-22 MED ORDER — BUTALBITAL-APAP-CAFFEINE 50-325-40 MG PO CAPS: 1.0000 | ORAL_CAPSULE | Freq: Four times a day (QID) | ORAL | 3 refills | Status: DC | PRN

## 2019-05-22 NOTE — Patient Instructions (Signed)
Braxton Hicks Contractions Contractions of the uterus can occur throughout pregnancy, but they are not always a sign that you are in labor. You may have practice contractions called Braxton Hicks contractions. These false labor contractions are sometimes confused with true labor. What are Braxton Hicks contractions? Braxton Hicks contractions are tightening movements that occur in the muscles of the uterus before labor. Unlike true labor contractions, these contractions do not result in opening (dilation) and thinning of the cervix. Toward the end of pregnancy (32-34 weeks), Braxton Hicks contractions can happen more often and may become stronger. These contractions are sometimes difficult to tell apart from true labor because they can be very uncomfortable. You should not feel embarrassed if you go to the hospital with false labor. Sometimes, the only way to tell if you are in true labor is for your health care provider to look for changes in the cervix. The health care provider will do a physical exam and may monitor your contractions. If you are not in true labor, the exam should show that your cervix is not dilating and your water has not broken. If there are no other health problems associated with your pregnancy, it is completely safe for you to be sent home with false labor. You may continue to have Braxton Hicks contractions until you go into true labor. How to tell the difference between true labor and false labor True labor  Contractions last 30-70 seconds.  Contractions become very regular.  Discomfort is usually felt in the top of the uterus, and it spreads to the lower abdomen and low back.  Contractions do not go away with walking.  Contractions usually become more intense and increase in frequency.  The cervix dilates and gets thinner. False labor  Contractions are usually shorter and not as strong as true labor contractions.  Contractions are usually irregular.  Contractions  are often felt in the front of the lower abdomen and in the groin.  Contractions may go away when you walk around or change positions while lying down.  Contractions get weaker and are shorter-lasting as time goes on.  The cervix usually does not dilate or become thin. Follow these instructions at home:   Take over-the-counter and prescription medicines only as told by your health care provider.  Keep up with your usual exercises and follow other instructions from your health care provider.  Eat and drink lightly if you think you are going into labor.  If Braxton Hicks contractions are making you uncomfortable: ? Change your position from lying down or resting to walking, or change from walking to resting. ? Sit and rest in a tub of warm water. ? Drink enough fluid to keep your urine pale yellow. Dehydration may cause these contractions. ? Do slow and deep breathing several times an hour.  Keep all follow-up prenatal visits as told by your health care provider. This is important. Contact a health care provider if:  You have a fever.  You have continuous pain in your abdomen. Get help right away if:  Your contractions become stronger, more regular, and closer together.  You have fluid leaking or gushing from your vagina.  You pass blood-tinged mucus (bloody show).  You have bleeding from your vagina.  You have low back pain that you never had before.  You feel your baby's head pushing down and causing pelvic pressure.  Your baby is not moving inside you as much as it used to. Summary  Contractions that occur before labor are   called Braxton Hicks contractions, false labor, or practice contractions.  Braxton Hicks contractions are usually shorter, weaker, farther apart, and less regular than true labor contractions. True labor contractions usually become progressively stronger and regular, and they become more frequent.  Manage discomfort from Braxton Hicks contractions  by changing position, resting in a warm bath, drinking plenty of water, or practicing deep breathing. This information is not intended to replace advice given to you by your health care provider. Make sure you discuss any questions you have with your health care provider. Document Released: 02/08/2017 Document Revised: 09/07/2017 Document Reviewed: 02/08/2017 Elsevier Patient Education  2020 Elsevier Inc.  

## 2019-05-22 NOTE — Progress Notes (Signed)
   PRENATAL VISIT NOTE  Subjective:  Jacqueline Mitchell is a 23 y.o. G1P0 at [redacted]w[redacted]d being seen today for ongoing prenatal care.  She is currently monitored for the following issues for this low-risk pregnancy and has Supervision of normal first pregnancy; Late prenatal care affecting pregnancy in second trimester; and Gastroesophageal reflux during pregnancy, antepartum on their problem list.  Patient reports headache, nausea and occasional contractions. Denies fever, chills, vomiting, diarrhea, vision changes, epigastric pain, edema. Hasn't tried anything t Tx HA. No HA now. Moderate, throbbing when they occur. No Hx HA's or HTN.  Contractions: Irregular. Vag. Bleeding: None.  Movement: Present. Denies leaking of fluid.   The following portions of the patient's history were reviewed and updated as appropriate: allergies, current medications, past family history, past medical history, past social history, past surgical history and problem list.   Objective:   Vitals:   05/22/19 1423  BP: 119/75  Pulse: 72  Weight: 147 lb 12.8 oz (67 kg)    Fetal Status: Fetal Heart Rate (bpm): 141 Fundal Height: 36 cm Movement: Present  Presentation: Vertex  General:  Alert, oriented and cooperative. Patient is in no acute distress.  Skin: Skin is warm and dry. No rash noted.   Cardiovascular: Normal heart rate noted  Respiratory: Normal respiratory effort, no problems with respiration noted  Abdomen: Soft, gravid, appropriate for gestational age.  Pain/Pressure: Absent     Pelvic: Cervical exam performed Dilation: 1 Effacement (%): 0 Station: -3  Extremities: Normal range of motion.  Edema: None  Mental Status: Normal mood and affect. Normal behavior. Normal judgment and thought content.   Assessment and Plan:  Pregnancy: G1P0 at [redacted]w[redacted]d 1. Encounter for supervision of normal first pregnancy in third trimester - GBS, Cultures.   2. HA--Nml BP. No HA red flags.  - ES Tylenol PRN, increase fluids, rest in dark  room, caffeine.  - Ha red flags reviewed. - Pre-E precautions.  - Rx Fioricet  3. Nausea--Unknown etiology - Rx Zofran  Term labor symptoms and general obstetric precautions including but not limited to vaginal bleeding, contractions, leaking of fluid and fetal movement were reviewed in detail with the patient. Please refer to After Visit Summary for other counseling recommendations.   Return in about 1 week (around 05/29/2019) for [place], Virtual Visit.  No future appointments.  Manya Silvas, CNM

## 2019-05-24 LAB — CERVICOVAGINAL ANCILLARY ONLY
Chlamydia: NEGATIVE
Neisseria Gonorrhea: NEGATIVE

## 2019-05-24 LAB — STREP GP B NAA: Strep Gp B NAA: POSITIVE — AB

## 2019-05-25 ENCOUNTER — Encounter: Payer: Self-pay | Admitting: Advanced Practice Midwife

## 2019-05-25 DIAGNOSIS — O99820 Streptococcus B carrier state complicating pregnancy: Secondary | ICD-10-CM | POA: Insufficient documentation

## 2019-05-29 ENCOUNTER — Ambulatory Visit (INDEPENDENT_AMBULATORY_CARE_PROVIDER_SITE_OTHER): Payer: Medicaid Other | Admitting: Obstetrics

## 2019-05-29 ENCOUNTER — Encounter: Payer: Self-pay | Admitting: Obstetrics

## 2019-05-29 VITALS — BP 106/70 | HR 88

## 2019-05-29 DIAGNOSIS — Z3400 Encounter for supervision of normal first pregnancy, unspecified trimester: Secondary | ICD-10-CM

## 2019-05-29 DIAGNOSIS — O0932 Supervision of pregnancy with insufficient antenatal care, second trimester: Secondary | ICD-10-CM

## 2019-05-29 DIAGNOSIS — Z3A37 37 weeks gestation of pregnancy: Secondary | ICD-10-CM

## 2019-05-29 DIAGNOSIS — O0933 Supervision of pregnancy with insufficient antenatal care, third trimester: Secondary | ICD-10-CM

## 2019-05-29 MED ORDER — VITAFOL GUMMIES 3.33-0.333-34.8 MG PO CHEW: 3.0000 | CHEWABLE_TABLET | Freq: Every day | ORAL | 3 refills | Status: DC

## 2019-05-29 NOTE — Progress Notes (Signed)
   TELEHEALTH OBSTETRICS PRENATAL VIRTUAL VIDEO VISIT ENCOUNTER NOTE  Provider location: [facility] [place]   I connected with Jacqueline Mitchell on 05/29/19 at 10:30 AM EDT by WebEx OB MyChart Video Encounter at home and verified that I am speaking with the correct person using two identifiers.   I discussed the limitations, risks, security and privacy concerns of performing an evaluation and management service virtually and the availability of in person appointments. I also discussed with the patient that there may be a patient responsible charge related to this service. The patient expressed understanding and agreed to proceed. Subjective:  Jacqueline Mitchell is a 23 y.o. G1P0 at [redacted]w[redacted]d being seen today for ongoing prenatal care.  She is currently monitored for the following issues for this low-risk pregnancy and has Supervision of normal first pregnancy; Late prenatal care affecting pregnancy in second trimester; Gastroesophageal reflux during pregnancy, antepartum; and Group B Streptococcus carrier, antepartum on their problem list.  Patient reports backache and heartburn.  Contractions: Irregular. Vag. Bleeding: None.  Movement: Present. Denies any leaking of fluid.   The following portions of the patient's history were reviewed and updated as appropriate: allergies, current medications, past family history, past medical history, past social history, past surgical history and problem list.   Objective:   Vitals:   05/29/19 1024  BP: 106/70  Pulse: 88    Fetal Status:     Movement: Present     General:  Alert, oriented and cooperative. Patient is in no acute distress.  Respiratory: Normal respiratory effort, no problems with respiration noted  Mental Status: Normal mood and affect. Normal behavior. Normal judgment and thought content.  Rest of physical exam deferred due to type of encounter  Imaging: No results found.  Assessment and Plan:  Pregnancy: G1P0 at [redacted]w[redacted]d 1.  Supervision of normal first pregnancy, antepartum Rx: - Prenatal Vit-Fe Phos-FA-Omega (VITAFOL GUMMIES) 3.33-0.333-34.8 MG CHEW; Chew 3 tablets by mouth daily.  Dispense: 90 tablet; Refill: 3  2. Late prenatal care affecting pregnancy in second trimester   Term labor symptoms and general obstetric precautions including but not limited to vaginal bleeding, contractions, leaking of fluid and fetal movement were reviewed in detail with the patient. I discussed the assessment and treatment plan with the patient. The patient was provided an opportunity to ask questions and all were answered. The patient agreed with the plan and demonstrated an understanding of the instructions. The patient was advised to call back or seek an in-person office evaluation/go to MAU at [facility] for any urgent or concerning symptoms. Please refer to After Visit Summary for other counseling recommendations.   I provided 10 minutes of face-to-face time during this encounter.  Return in about 1 week (around 06/05/2019) for MyChart.    Baltazar Najjar, MD Center for [facility] 05/29/2019

## 2019-06-05 ENCOUNTER — Ambulatory Visit (INDEPENDENT_AMBULATORY_CARE_PROVIDER_SITE_OTHER): Payer: Medicaid Other | Admitting: Obstetrics

## 2019-06-05 ENCOUNTER — Encounter: Payer: Self-pay | Admitting: Obstetrics

## 2019-06-05 ENCOUNTER — Inpatient Hospital Stay (HOSPITAL_BASED_OUTPATIENT_CLINIC_OR_DEPARTMENT_OTHER): Payer: Medicaid Other

## 2019-06-05 ENCOUNTER — Inpatient Hospital Stay (HOSPITAL_COMMUNITY)
Admission: AD | Admit: 2019-06-05 | Discharge: 2019-06-05 | Disposition: A | Discharge status: Home / Self Care | Payer: Medicaid Other | Attending: Obstetrics and Gynecology | Admitting: Obstetrics and Gynecology

## 2019-06-05 ENCOUNTER — Other Ambulatory Visit: Payer: Self-pay

## 2019-06-05 ENCOUNTER — Encounter (HOSPITAL_COMMUNITY): Payer: Self-pay | Admitting: *Deleted

## 2019-06-05 VITALS — BP 101/62 | HR 80 | Wt 145.0 lb

## 2019-06-05 DIAGNOSIS — K219 Gastro-esophageal reflux disease without esophagitis: Secondary | ICD-10-CM | POA: Insufficient documentation

## 2019-06-05 DIAGNOSIS — Z3A38 38 weeks gestation of pregnancy: Secondary | ICD-10-CM | POA: Insufficient documentation

## 2019-06-05 DIAGNOSIS — Z3689 Encounter for other specified antenatal screening: Secondary | ICD-10-CM | POA: Insufficient documentation

## 2019-06-05 DIAGNOSIS — O368130 Decreased fetal movements, third trimester, not applicable or unspecified: Secondary | ICD-10-CM

## 2019-06-05 DIAGNOSIS — Z3403 Encounter for supervision of normal first pregnancy, third trimester: Secondary | ICD-10-CM

## 2019-06-05 DIAGNOSIS — O0933 Supervision of pregnancy with insufficient antenatal care, third trimester: Secondary | ICD-10-CM

## 2019-06-05 DIAGNOSIS — O99613 Diseases of the digestive system complicating pregnancy, third trimester: Secondary | ICD-10-CM | POA: Diagnosis not present

## 2019-06-05 DIAGNOSIS — Z833 Family history of diabetes mellitus: Secondary | ICD-10-CM | POA: Diagnosis not present

## 2019-06-05 HISTORY — DX: Other specified health status: Z78.9

## 2019-06-05 LAB — URINALYSIS, ROUTINE W REFLEX MICROSCOPIC
Bilirubin Urine: NEGATIVE
Glucose, UA: NEGATIVE mg/dL
Hgb urine dipstick: NEGATIVE
Ketones, ur: NEGATIVE mg/dL
Leukocytes,Ua: NEGATIVE
Nitrite: NEGATIVE
Protein, ur: 30 mg/dL — AB
Specific Gravity, Urine: 1.018 (ref 1.005–1.030)
pH: 7 (ref 5.0–8.0)

## 2019-06-05 NOTE — MAU Note (Signed)
Reports decrease in FM since Tues.  Denies pain, bleeding or leaking.

## 2019-06-05 NOTE — Progress Notes (Signed)
I connected with Jacqueline Mitchell on 06/05/19 at 10:30 AM EDT by telephone and verified that I am speaking with the correct person using two identifiers.  Webex [name]: Pt c/o DEC FM since Tuesday

## 2019-06-05 NOTE — MAU Provider Note (Addendum)
Chief Complaint:  Decreased Fetal Movement   HPI: Jacqueline Mitchell is a 23 y.o. G1P0 at [redacted]w[redacted]d by ultrasound who presents to maternity admissions reporting decreased fetal movements. She last felt the baby move 3 days ago (Monday morning) and has not felt the baby move at all since. She notes baby usually moves when she wakes up and after she has something sugary. She has also felt lower pelvic tightness about once a day for the past 2 weeks. They have not changed in frequency or quality since they started. She has also had intermittent RUQ,mid, and LUQ sharp pains for the past 2 weeks. She reports 1 headache 2 weeks ago that felt like tension and resolved on its own. No headaches throughout the rest of her headache or currently. She has felt some urinary urgency for the past 2 weeks but denies pain or burning with urination. Denies LOF, vaginal bleeding, vaginal itching/burning, dizziness, n/v, or fever/chills.    Past Medical History: Past Medical History:  Diagnosis Date  . Medical history non-contributory     Past obstetric history: OB History  Gravida Para Term Preterm AB Living  1            SAB TAB Ectopic Multiple Live Births               # Outcome Date GA Lbr Len/2nd Weight Sex Delivery Anes PTL Lv  1 Current             Past Surgical History: Past Surgical History:  Procedure Laterality Date  . NO PAST SURGERIES      Family History: Family History  Problem Relation Age of Onset  . Diabetes Mother   . Miscarriages / [name] Mother     Social History: Social History   Tobacco Use  . Smoking status: Never Smoker  . Smokeless tobacco: Never Used  Substance Use Topics  . Alcohol use: Not Currently    Comment: Occasional  . Drug use: Never    Allergies: No Known Allergies  Meds:  No medications prior to admission.    ROS:  Review of Systems  Constitutional: Negative for chills and fever.  Gastrointestinal: Positive for abdominal pain (RUQ currently (sharp),  intermittently mid abdominal sharp pains). Negative for nausea and vomiting.  Genitourinary: Positive for difficulty urinating (feels like she has to urinate but can't) and pelvic pain (tightness). Negative for dysuria, vaginal bleeding and vaginal discharge.  Neurological: Negative for headaches (1 2 weeks ago but none since).    I have reviewed patient's Past Medical Hx, Surgical Hx, Family Hx, Social Hx, medications and allergies.   Physical Exam   Patient Vitals for the past 24 hrs:  BP Temp Temp src Pulse Resp SpO2 Weight  06/05/19 1351 115/62 - - - 18 - -  06/05/19 1128 104/72 98.4 F (36.9 C) Oral (!) 114 20 99 % -  06/05/19 1117 - - - - - - 67.5 kg   Constitutional: Well-developed, well-nourished female in no acute distress.  Cardiovascular: normal rate Respiratory: normal effort GI: Abd soft, non-tender, gravid appropriate for gestational age.  MS: Extremities nontender, no edema, normal ROM Neurologic: Alert and oriented x 4.  Bimanual exam:  Dilation: 1.5 Effacement (%): 80 Station: -1 Presentation: Vertex Exam by:: L. Leftwich-Kirby, CNM  FHT:  Baseline 135-140, moderate variability, accelerations present, no decelerations Contractions: uterine irritability with occasional contractions Labs: Results for orders placed or performed during the hospital encounter of 06/05/19 (from the past 24 hour(s))  Urinalysis, Routine  w reflex microscopic     Status: Abnormal   Collection Time: 06/05/19 12:42 PM  Result Value Ref Range   Color, Urine YELLOW YELLOW   APPearance HAZY (A) CLEAR   Specific Gravity, Urine 1.018 1.005 - 1.030   pH 7.0 5.0 - 8.0   Glucose, UA NEGATIVE NEGATIVE mg/dL   Hgb urine dipstick NEGATIVE NEGATIVE   Bilirubin Urine NEGATIVE NEGATIVE   Ketones, ur NEGATIVE NEGATIVE mg/dL   Protein, ur 30 (A) NEGATIVE mg/dL   Nitrite NEGATIVE NEGATIVE   Leukocytes,Ua NEGATIVE NEGATIVE   RBC / HPF 0-5 0 - 5 RBC/hpf   WBC, UA 6-10 0 - 5 WBC/hpf   Bacteria, UA  RARE (A) NONE SEEN   Squamous Epithelial / LPF 0-5 0 - 5   Mucus PRESENT    B/Positive/-- (03/19 1125)  Imaging:  BPP 8/8  MAU Course/MDM: Orders Placed This Encounter  Procedures  . US MFM Fetal BPP Wo Non Stress  . Urinalysis, Routine w reflex microscopic  . Discharge patient    No orders of the defined types were placed in this encounter.    NST reviewed, reactive strip UA: protein 30 No meds given in MAU.    NST reactive but patient feeling little fetal movement, so BPP performed. BPP 8/8, reassuring.  Pt discharged home with strict labor return precautions.   Assessment: 1. Decreased fetal movements in third trimester, single or unspecified fetus   2. Gastroesophageal reflux during pregnancy, antepartum   3. NST (non-stress test) reactive     Plan: Discharged home Labor precautions and fetal kick counts [facility] Information    [facility].   Why: In 1 week, in office visit. Return to MAU as needed for signs of labor or emergencies. Contact information: [address] [place] [id number] [phone]         Allergies as of 06/05/2019   No Known Allergies      Cora Danielsicole Chang [facility] 06/05/2019 2:08 PM    I confirm that I have verified the information documented in the medical student's note and that I have also personally reperformed the history, physical exam and all medical decision making activities of this service and have verified that all service and findings are accurately documented in this student's note.   Reactive NST and 8/8 BPP for 10/10 today.  Discussed fetal kick counting with pt, encouraging her to drink cold water and lie down in quiet room to feel for movement.  Return to MAU if movement remains decreased or with any signs of labor or emergencies.  Hurshel PartyLeftwich-Kirby, Elaina Cara A, CNM 06/06/2019 8:22 AM

## 2019-06-05 NOTE — Discharge Instructions (Signed)
Reasons to return to MAU at [facility]:  1.  Contractions are  5 minutes apart or less, each last 1 minute, these have been going on for 1-2 hours, and you cannot walk or talk during them 2.  You have a large gush of fluid, or a trickle of fluid that will not stop and you have to wear a pad 3.  You have bleeding that is bright red, heavier than spotting--like menstrual bleeding (spotting can be normal in early labor or after a check of your cervix) 4.  You do not feel the baby moving like he/she normally does  

## 2019-06-05 NOTE — MAU Note (Signed)
Not enough urine for culture tube 

## 2019-06-05 NOTE — Progress Notes (Signed)
   [facility] VIRTUAL VIDEO VISIT ENCOUNTER NOTE  Provider location: [facility] [place]   I connected with Jacqueline Mitchell on 06/05/19 at 10:30 AM EDT by WebEx OB MyChart Video Encounter at home and verified that I am speaking with the correct person using two identifiers.   I discussed the limitations, risks, security and privacy concerns of performing an evaluation and management service virtually and the availability of in person appointments. I also discussed with the patient that there may be a patient responsible charge related to this service. The patient expressed understanding and agreed to proceed. Subjective:  Jacqueline Mitchell is a 23 y.o. G1P0 at [redacted]w[redacted]d being seen today for ongoing prenatal care.  She is currently monitored for the following issues for this low-risk pregnancy and has Supervision of normal first pregnancy; Late prenatal care affecting pregnancy in second trimester; Gastroesophageal reflux during pregnancy, antepartum; and Group B Streptococcus carrier, antepartum on their problem list.  Patient reports no complaints.  Contractions: Irregular. Vag. Bleeding: None.  Movement: (!) Decreased. Denies any leaking of fluid.   The following portions of the patient's history were reviewed and updated as appropriate: allergies, current medications, past family history, past medical history, past social history, past surgical history and problem list.   Objective:   Vitals:   06/05/19 1033  BP: 101/62  Pulse: 80  Weight: 145 lb (65.8 kg)    Fetal Status:     Movement: (!) Decreased     General:  Alert, oriented and cooperative. Patient is in no acute distress.  Respiratory: Normal respiratory effort, no problems with respiration noted  Mental Status: Normal mood and affect. Normal behavior. Normal judgment and thought content.  Rest of physical exam deferred due to type of encounter  Imaging: No results found.  Assessment and Plan:   Pregnancy: G1P0 at [redacted]w[redacted]d  1. Encounter for supervision of normal first pregnancy in third trimester  2. Decreased fetal movement affecting management of pregnancy in third trimester, single or unspecified fetus - patient instructed to go to [facility] for further evaluation   Term labor symptoms and general obstetric precautions including but not limited to vaginal bleeding, contractions, leaking of fluid and fetal movement were reviewed in detail with the patient. I discussed the assessment and treatment plan with the patient. The patient was provided an opportunity to ask questions and all were answered. The patient agreed with the plan and demonstrated an understanding of the instructions. The patient was advised to call back or seek an in-person office evaluation/go to [facility] at [facility] for any urgent or concerning symptoms. Please refer to After Visit Summary for other counseling recommendations.   I provided 10 minutes of face-to-face time during this encounter.  Return in about 1 week (around 06/12/2019) for [name] in person.  No future appointments.  Baltazar Najjar, MD Center for [facility] [place] [facility] 06/05/2019

## 2019-06-12 ENCOUNTER — Telehealth (HOSPITAL_COMMUNITY): Payer: Self-pay | Admitting: *Deleted

## 2019-06-12 ENCOUNTER — Ambulatory Visit (INDEPENDENT_AMBULATORY_CARE_PROVIDER_SITE_OTHER): Payer: Medicaid Other | Admitting: Obstetrics and Gynecology

## 2019-06-12 ENCOUNTER — Encounter: Payer: Self-pay | Admitting: Obstetrics and Gynecology

## 2019-06-12 ENCOUNTER — Encounter (HOSPITAL_COMMUNITY): Payer: Self-pay | Admitting: *Deleted

## 2019-06-12 ENCOUNTER — Other Ambulatory Visit: Payer: Self-pay

## 2019-06-12 VITALS — BP 110/67 | HR 98 | Wt 152.0 lb

## 2019-06-12 DIAGNOSIS — Z3A39 39 weeks gestation of pregnancy: Secondary | ICD-10-CM

## 2019-06-12 DIAGNOSIS — Z3403 Encounter for supervision of normal first pregnancy, third trimester: Secondary | ICD-10-CM

## 2019-06-12 DIAGNOSIS — O99820 Streptococcus B carrier state complicating pregnancy: Secondary | ICD-10-CM

## 2019-06-12 NOTE — Progress Notes (Signed)
   PRENATAL VISIT NOTE  Subjective:  Jacqueline Mitchell is a 23 y.o. G1P0 at [redacted]w[redacted]d being seen today for ongoing prenatal care.  She is currently monitored for the following issues for this low-risk pregnancy and has Supervision of normal first pregnancy; Late prenatal care affecting pregnancy in second trimester; Gastroesophageal reflux during pregnancy, antepartum; and Group B Streptococcus carrier, antepartum on their problem list.  Patient reports no complaints.  Contractions: Irregular. Vag. Bleeding: None.  Movement: Present. Denies leaking of fluid.   The following portions of the patient's history were reviewed and updated as appropriate: allergies, current medications, past family history, past medical history, past social history, past surgical history and problem list.   Objective:   Vitals:   06/12/19 1325  BP: 110/67  Pulse: 98  Weight: 152 lb (68.9 kg)    Fetal Status: Fetal Heart Rate (bpm): 130 Fundal Height: 39 cm Movement: Present     General:  Alert, oriented and cooperative. Patient is in no acute distress.  Skin: Skin is warm and dry. No rash noted.   Cardiovascular: Normal heart rate noted  Respiratory: Normal respiratory effort, no problems with respiration noted  Abdomen: Soft, gravid, appropriate for gestational age.  Pain/Pressure: Absent     Pelvic: Cervical exam deferred        Extremities: Normal range of motion.     Mental Status: Normal mood and affect. Normal behavior. Normal judgment and thought content.   Assessment and Plan:  Pregnancy: G1P0 at [redacted]w[redacted]d 1. Encounter for supervision of normal first pregnancy in third trimester Patient is doing well without complaints IOL at 41 weeks Postdate testing next week  2. Group B Streptococcus carrier, antepartum Will treat in labor  Term labor symptoms and general obstetric precautions including but not limited to vaginal bleeding, contractions, leaking of fluid and fetal movement were reviewed in detail with the  patient. Please refer to After Visit Summary for other counseling recommendations.   Return in about 1 week (around 06/19/2019) for in person, [name], NST.  Future Appointments  Date Time Provider [place]  06/20/2019  9:30 AM Luvenia Redden, PA-C [facility] None    Mora Bellman, MD

## 2019-06-12 NOTE — Telephone Encounter (Signed)
Preadmission screen  

## 2019-06-16 ENCOUNTER — Other Ambulatory Visit: Payer: Self-pay | Admitting: Advanced Practice Midwife

## 2019-06-19 ENCOUNTER — Inpatient Hospital Stay (HOSPITAL_COMMUNITY)
Admission: AD | Admit: 2019-06-19 | Discharge: 2019-06-22 | DRG: 807 | Disposition: A | Discharge status: Home / Self Care | Payer: Medicaid Other | Attending: Obstetrics and Gynecology | Admitting: Obstetrics and Gynecology

## 2019-06-19 ENCOUNTER — Inpatient Hospital Stay (HOSPITAL_COMMUNITY): Payer: Medicaid Other | Admitting: Anesthesiology

## 2019-06-19 ENCOUNTER — Other Ambulatory Visit: Payer: Self-pay

## 2019-06-19 ENCOUNTER — Encounter (HOSPITAL_COMMUNITY): Payer: Self-pay | Admitting: *Deleted

## 2019-06-19 DIAGNOSIS — O99824 Streptococcus B carrier state complicating childbirth: Secondary | ICD-10-CM | POA: Diagnosis present

## 2019-06-19 DIAGNOSIS — O9962 Diseases of the digestive system complicating childbirth: Secondary | ICD-10-CM | POA: Diagnosis present

## 2019-06-19 DIAGNOSIS — Z20828 Contact with and (suspected) exposure to other viral communicable diseases: Secondary | ICD-10-CM | POA: Diagnosis present

## 2019-06-19 DIAGNOSIS — Z3490 Encounter for supervision of normal pregnancy, unspecified, unspecified trimester: Secondary | ICD-10-CM | POA: Diagnosis present

## 2019-06-19 DIAGNOSIS — O480 Post-term pregnancy: Secondary | ICD-10-CM | POA: Diagnosis present

## 2019-06-19 DIAGNOSIS — K219 Gastro-esophageal reflux disease without esophagitis: Secondary | ICD-10-CM | POA: Diagnosis present

## 2019-06-19 DIAGNOSIS — Z3A40 40 weeks gestation of pregnancy: Secondary | ICD-10-CM

## 2019-06-19 DIAGNOSIS — O99619 Diseases of the digestive system complicating pregnancy, unspecified trimester: Secondary | ICD-10-CM

## 2019-06-19 LAB — SARS CORONAVIRUS 2 BY RT PCR (HOSPITAL ORDER, PERFORMED IN ~~LOC~~ HOSPITAL LAB): SARS Coronavirus 2: NEGATIVE

## 2019-06-19 LAB — CBC
HCT: 32.8 % — ABNORMAL LOW (ref 36.0–46.0)
Hemoglobin: 9.9 g/dL — ABNORMAL LOW (ref 12.0–15.0)
MCH: 23.9 pg — ABNORMAL LOW (ref 26.0–34.0)
MCHC: 30.2 g/dL (ref 30.0–36.0)
MCV: 79 fL — ABNORMAL LOW (ref 80.0–100.0)
Platelets: 349 10*3/uL (ref 150–400)
RBC: 4.15 MIL/uL (ref 3.87–5.11)
RDW: 15.9 % — ABNORMAL HIGH (ref 11.5–15.5)
WBC: 16.2 10*3/uL — ABNORMAL HIGH (ref 4.0–10.5)
nRBC: 0.1 % (ref 0.0–0.2)

## 2019-06-19 LAB — TYPE AND SCREEN
ABO/RH(D): B POS
Antibody Screen: NEGATIVE

## 2019-06-19 LAB — POCT FERN TEST: POCT Fern Test: POSITIVE

## 2019-06-19 LAB — ABO/RH: ABO/RH(D): B POS

## 2019-06-19 MED ORDER — OXYCODONE-ACETAMINOPHEN 5-325 MG PO TABS: 1.0000 | ORAL_TABLET | ORAL | Status: DC | PRN

## 2019-06-19 MED ORDER — FENTANYL-BUPIVACAINE-NACL 0.5-0.125-0.9 MG/250ML-% EP SOLN
12.0000 mL/h | EPIDURAL | Status: DC | PRN
  Filled 2019-06-19: qty 250

## 2019-06-19 MED ORDER — OXYTOCIN 40 UNITS IN NORMAL SALINE INFUSION - SIMPLE MED: 2.5000 [IU]/h | INTRAVENOUS | Status: DC

## 2019-06-19 MED ORDER — PENICILLIN G 3 MILLION UNITS IVPB - SIMPLE MED
3.0000 10*6.[IU] | INTRAVENOUS | Status: DC
  Administered 2019-06-19 – 2019-06-20 (×4): 3 10*6.[IU] via INTRAVENOUS
  Filled 2019-06-19 (×4): qty 100

## 2019-06-19 MED ORDER — OXYTOCIN BOLUS FROM INFUSION
500.0000 mL | Freq: Once | INTRAVENOUS | Status: AC
  Administered 2019-06-20: 09:00:00 500 mL via INTRAVENOUS

## 2019-06-19 MED ORDER — OXYTOCIN 40 UNITS IN NORMAL SALINE INFUSION - SIMPLE MED
1.0000 m[IU]/min | INTRAVENOUS | Status: DC
  Administered 2019-06-19: 14:00:00 2 m[IU]/min via INTRAVENOUS
  Filled 2019-06-19: qty 1000

## 2019-06-19 MED ORDER — FENTANYL-BUPIVACAINE-NACL 0.5-0.125-0.9 MG/250ML-% EP SOLN: 12.0000 mL/h | EPIDURAL | Status: DC | PRN

## 2019-06-19 MED ORDER — LACTATED RINGERS IV SOLN: 500.0000 mL | Freq: Once | INTRAVENOUS | Status: DC

## 2019-06-19 MED ORDER — DIPHENHYDRAMINE HCL 50 MG/ML IJ SOLN: 12.5000 mg | INTRAMUSCULAR | Status: DC | PRN

## 2019-06-19 MED ORDER — TERBUTALINE SULFATE 1 MG/ML IJ SOLN: 0.2500 mg | Freq: Once | INTRAMUSCULAR | Status: DC | PRN

## 2019-06-19 MED ORDER — SODIUM CHLORIDE 0.9 % IV SOLN
5.0000 10*6.[IU] | Freq: Once | INTRAVENOUS | Status: AC
  Administered 2019-06-19: 5 10*6.[IU] via INTRAVENOUS
  Filled 2019-06-19: qty 5

## 2019-06-19 MED ORDER — LACTATED RINGERS IV SOLN
INTRAVENOUS | Status: DC
  Administered 2019-06-19: 11:00:00 via INTRAVENOUS

## 2019-06-19 MED ORDER — ACETAMINOPHEN 325 MG PO TABS: 650.0000 mg | ORAL_TABLET | ORAL | Status: DC | PRN

## 2019-06-19 MED ORDER — ONDANSETRON HCL 4 MG/2ML IJ SOLN: 4.0000 mg | Freq: Four times a day (QID) | INTRAMUSCULAR | Status: DC | PRN

## 2019-06-19 MED ORDER — SOD CITRATE-CITRIC ACID 500-334 MG/5ML PO SOLN: 30.0000 mL | ORAL | Status: DC | PRN

## 2019-06-19 MED ORDER — LACTATED RINGERS IV SOLN
500.0000 mL | INTRAVENOUS | Status: DC | PRN
  Administered 2019-06-19: 22:00:00 500 mL via INTRAVENOUS

## 2019-06-19 MED ORDER — EPHEDRINE 5 MG/ML INJ: 10.0000 mg | INTRAVENOUS | Status: DC | PRN

## 2019-06-19 MED ORDER — LIDOCAINE HCL (PF) 1 % IJ SOLN
INTRAMUSCULAR | Status: DC | PRN
  Administered 2019-06-19 (×2): 5 mL via EPIDURAL

## 2019-06-19 MED ORDER — LIDOCAINE HCL (PF) 1 % IJ SOLN: 30.0000 mL | INTRAMUSCULAR | Status: DC | PRN

## 2019-06-19 MED ORDER — PHENYLEPHRINE 40 MCG/ML (10ML) SYRINGE FOR IV PUSH (FOR BLOOD PRESSURE SUPPORT): 80.0000 ug | PREFILLED_SYRINGE | INTRAVENOUS | Status: DC | PRN

## 2019-06-19 MED ORDER — SODIUM CHLORIDE (PF) 0.9 % IJ SOLN
INTRAMUSCULAR | Status: DC | PRN
  Administered 2019-06-19: 12 mL/h via EPIDURAL

## 2019-06-19 MED ORDER — OXYCODONE-ACETAMINOPHEN 5-325 MG PO TABS: 2.0000 | ORAL_TABLET | ORAL | Status: DC | PRN

## 2019-06-19 MED ORDER — PHENYLEPHRINE 40 MCG/ML (10ML) SYRINGE FOR IV PUSH (FOR BLOOD PRESSURE SUPPORT)
80.0000 ug | PREFILLED_SYRINGE | INTRAVENOUS | Status: DC | PRN
  Filled 2019-06-19: qty 10

## 2019-06-19 NOTE — Progress Notes (Signed)
Jacqueline Mitchell is a 23 y.o. G1P0 at [redacted]w[redacted]d admitted for rupture of membranes  Subjective: Comfortable with epidural. Pit at 10.  Objective: BP 108/67   Pulse 85   Temp 98.2 F (36.8 C) (Oral)   Resp 17   Ht 5\' 2"  (1.575 m)   Wt 68.5 kg   LMP 08/03/2018 (Within Weeks)   SpO2 100%   BMI 27.62 kg/m  No intake/output data recorded.  FHT:  FHR: 145 bpm, variability: moderate,  accelerations:  Present,  decelerations:  Present (late) UC:   regular, every 2-4 minutes  SVE:   Dilation: 4 Effacement (%): 50 Station: -1 Exam by:: Sparicino   Pitocin @ 10 mu/min  Labs: Lab Results  Component Value Date   WBC 16.2 (H) 06/19/2019   HGB 9.9 (L) 06/19/2019   HCT 32.8 (L) 06/19/2019   MCV 79.0 (L) 06/19/2019   PLT 349 06/19/2019    Assessment / Plan: 23 yo G1P0 at [redacted]w[redacted]d admitted for SROM in early labor  Labor: SROM, Pitocin at 10. Continues to have some late decels with moderate variability. Good contraction pattern. Fetal Wellbeing:  Category II, fetal status reassuring with moderate variability and normal baseline Pain Control:  Epidural I/D:  GBS+ Anticipated MOD:  SVD, CS as appropriate  Jacqueline Baba DO OB Fellow, Faculty [facility] 06/19/2019, 10:29 PM

## 2019-06-19 NOTE — Anesthesia Preprocedure Evaluation (Signed)

## 2019-06-19 NOTE — Progress Notes (Signed)
06/19/2019 - 4:24 PM  23 y.o. G1P0 [redacted]w[redacted]d   Pregnancy complicated by late [facility], GBS +  Patient Active Problem List   Diagnosis Date Noted  . Encounter for induction of labor 06/19/2019  . Group B Streptococcus carrier, antepartum 05/25/2019  . Gastroesophageal reflux during pregnancy, antepartum 04/17/2019  . Late prenatal care affecting pregnancy in second trimester 12/26/2018  . Supervision of normal first pregnancy 12/10/2018    [name] is admitted for SROM at 0100   Subjective:  Doing well, still not uncomfortable with contractions on pitocin. She has no needs at this time.  Objective:   Vitals:   06/19/19 1402 06/19/19 1457 06/19/19 1538 06/19/19 1608  BP: 107/72 108/67 108/68 107/63  Pulse: 90 (!) 101 (!) 103 (!) 104  Resp: 18 18 18 18   Temp:      TempSrc:      Weight:      Height:        Current Vital Signs 24h Vital Sign Ranges  T 98.3 F (36.8 C) Temp  Avg: 98.2 F (36.8 C)  Min: 98 F (36.7 C)  Max: 98.3 F (36.8 C)  BP 107/63 BP  Min: 107/63  Max: 117/64  HR (!) 104 Pulse  Avg: 100.6  Min: 90  Max: 108  RR 18 Resp  Avg: 18.3  Min: 18  Max: 20  SaO2     No data recorded       24 Hour I/O Current Shift I/O  Time Ins Outs No intake/output data recorded. No intake/output data recorded.   FHR: 140 baseline, good variability, + accels, 2 lates  decels noted, but improved with positioning.  Toco: q2-50mins  SVE: 3/80/-2   Assessment & Plan:  FHT CAT 1 overall, some decels associated with changes in pitocin GBS: positive, on dose two of PCN Pitocin @12 , could consider IUPC if no cervical change at next check Analgesia: none currently

## 2019-06-19 NOTE — MAU Note (Signed)
Pt reports she has had leaking thick white fluid off and on since last night. Denies any cramping or contractions at this time. Good fetal movement reported.

## 2019-06-19 NOTE — Progress Notes (Signed)
LABOR PROGRESS NOTE  Jacqueline Mitchell is a 23 y.o. G1P0 at [redacted]w[redacted]d  admitted for SROM.  Subjective: Starting to feel her contractions  Objective: BP (!) 94/56   Pulse 98   Temp 98.2 F (36.8 C) (Oral)   Resp 18   Ht 5\' 2"  (1.575 m)   Wt 68.5 kg   LMP 08/03/2018 (Within Weeks)   BMI 27.62 kg/m  or  Vitals:   06/19/19 1701 06/19/19 1726 06/19/19 1731 06/19/19 1802  BP: (!) 95/53 (!) 94/59 (!) 97/55 (!) 94/56  Pulse: (!) 101 98 99 98  Resp: 18  18   Temp:    98.2 F (36.8 C)  TempSrc:    Oral  Weight:      Height:         Dilation: 3 Effacement (%): 80 Station: -2 Presentation: Vertex Exam by:: [employer] FHT: baseline rate 150, moderate varibility, +acel, +variable and late decel Toco: q2-3 min  Labs: Lab Results  Component Value Date   WBC 16.2 (H) 06/19/2019   HGB 9.9 (L) 06/19/2019   HCT 32.8 (L) 06/19/2019   MCV 79.0 (L) 06/19/2019   PLT 349 06/19/2019    Patient Active Problem List   Diagnosis Date Noted  . Encounter for induction of labor 06/19/2019  . Group B Streptococcus carrier, antepartum 05/25/2019  . Gastroesophageal reflux during pregnancy, antepartum 04/17/2019  . Late prenatal care affecting pregnancy in second trimester 12/26/2018  . Supervision of normal first pregnancy 12/10/2018    Assessment / Plan: 23 y.o. G1P0 at [redacted]w[redacted]d here for SROM in early labor.  Labor: early labor, strip starting to have recurrent variable and late decels since increasing pitocin from 10-->12. On review of strip also had signs of stress when pitocin from 8-->10 though not persistent at that time and not with lates. Has good contraction pattern now, reduce to 10u and see if can maintain adequate pattern, if not cautiously increase again.  Fetal Wellbeing:  Cat II, though reassuring with normal baseline and moderate variability Pain Control:  unmedicated Anticipated MOD:  SVD  Augustin Coupe, MD/[facility] OB Fellow  06/19/2019, 6:25 PM

## 2019-06-19 NOTE — Anesthesia Procedure Notes (Signed)
Epidural Patient location during procedure: OB  Staffing Anesthesiologist: Anjanette Gilkey, MD Performed: anesthesiologist   Preanesthetic Checklist Completed: patient identified, site marked, surgical consent, pre-op evaluation, timeout performed, IV checked, risks and benefits discussed and monitors and equipment checked  Epidural Patient position: sitting Prep: DuraPrep Patient monitoring: heart rate, continuous pulse ox and blood pressure Approach: right paramedian Location: L3-L4 Injection technique: LOR saline  Needle:  Needle type: Tuohy  Needle gauge: 17 G Needle length: 9 cm and 9 Needle insertion depth: 6 cm Catheter type: closed end flexible Catheter size: 20 Guage Catheter at skin depth: 10 cm Test dose: negative  Assessment Events: blood not aspirated, injection not painful, no injection resistance, negative IV test and no paresthesia  Additional Notes Patient identified. Risks/Benefits/Options discussed with patient including but not limited to bleeding, infection, nerve damage, paralysis, failed block, incomplete pain control, headache, blood pressure changes, nausea, vomiting, reactions to medication both or allergic, itching and postpartum back pain. Confirmed with bedside nurse the patient's most recent platelet count. Confirmed with patient that they are not currently taking any anticoagulation, have any bleeding history or any family history of bleeding disorders. Patient expressed understanding and wished to proceed. All questions were answered. Sterile technique was used throughout the entire procedure. Please see nursing notes for vital signs. Test dose was given through epidural needle and negative prior to continuing to dose epidural or start infusion. Warning signs of high block given to the patient including shortness of breath, tingling/numbness in hands, complete motor block, or any concerning symptoms with instructions to call for help. Patient was given  instructions on fall risk and not to get out of bed. All questions and concerns addressed with instructions to call with any issues.     

## 2019-06-19 NOTE — H&P (Addendum)
OBSTETRIC ADMISSION HISTORY AND PHYSICAL  Jacqueline Mitchell is a 23 y.o. female G1P0 with IUP at [redacted]w[redacted]d presenting for SROM. Spontaneous rupture of membranes occurred a t0100 this morning. She describes clear fluid without associated bleeding. Since then, she reports some abdominal discomfort but denies pain. Contractions are evident per monitoring but patient states she does not yet feel the contractions.  She reports +FMs.   Patient was found to be GBS positive during a prenatal visit in August.   No  VB, blurry vision, headaches, peripheral edema, or RUQ pain. She plans on breast and bottle feeding. She requests depo-provera for birth control.  Dating: changed on 2nd trimester [name] on 12/30/2018 to 16 weeks from discrepancy for LMP --->  Estimated Date of Delivery: 06/16/19  Sono:  8/27, BPP 8/8  @[redacted]w[redacted]d , CWD, normal anatomy, cephalic presentation   Prenatal History/Complications: Late to prenatal care w/dating discrepancies  GBS positive (08/13)  Past Medical History: Past Medical History:  Diagnosis Date  . Medical history non-contributory     Past Surgical History: Past Surgical History:  Procedure Laterality Date  . NO PAST SURGERIES      Obstetrical History: OB History    Gravida  1   Para      Term      Preterm      AB      Living        SAB      TAB      Ectopic      Multiple      Live Births              Social History: Social History   Socioeconomic History  . Marital status: Single    Spouse name: Not on file  . Number of children: Not on file  . Years of education: Not on file  . Highest education level: Not on file  Occupational History  . Not on file  Social Needs  . Financial resource strain: Not on file  . Food insecurity    Worry: Not on file    Inability: Not on file  . Transportation needs    Medical: Not on file    Non-medical: Not on file  Tobacco Use  . Smoking status: Never Smoker  . Smokeless tobacco: Never Used  Substance  and Sexual Activity  . Alcohol use: Not Currently    Comment: Occasional  . Drug use: Never  . Sexual activity: Yes    Birth control/protection: None  Lifestyle  . Physical activity    Days per week: Not on file    Minutes per session: Not on file  . Stress: Not on file  Relationships  . Social Herbalist on phone: Not on file    Gets together: Not on file    Attends religious service: Not on file    Active member of club or organization: Not on file    Attends meetings of clubs or organizations: Not on file    Relationship status: Not on file  Other Topics Concern  . Not on file  Social History Narrative  . Not on file    Family History: Family History  Problem Relation Age of Onset  . Diabetes Mother   . Miscarriages / [name] Mother     Allergies: No Known Allergies  Medications Prior to Admission  Medication Sig Dispense Refill Last Dose  . Prenatal Vit-Fe Phos-FA-Omega (VITAFOL GUMMIES) 3.33-0.333-34.8 MG CHEW Chew 3 tablets by mouth daily. [place]  tablet 3 06/19/2019 at 0900  . Butalbital-APAP-Caffeine 50-325-40 MG capsule Take 1-2 capsules by mouth every 6 (six) hours as needed for headache. (Patient not taking: Reported on 06/05/2019) 30 capsule 3   . ondansetron (ZOFRAN) 4 MG tablet Take 1 tablet (4 mg total) by mouth every 8 (eight) hours as needed for nausea or vomiting. (Patient not taking: Reported on 06/05/2019) 20 tablet 0      Review of Systems   All systems reviewed and negative except as stated in HPI  Blood pressure 116/80, pulse (!) 108, temperature 98.3 F (36.8 C), temperature source Oral, resp. rate 18, height 5\' 2"  (1.575 m), weight 68.5 kg, last menstrual period 08/03/2018. General appearance: alert, cooperative, appears stated age and no distress Lungs: regular rate and effort, no wheezes or rhonci  Heart: regular rate  Abdomen: soft, non-tender Extremities: Homans sign is negative, no edema, no sign of DVT Presentation:  cephalic Fetal monitoringBaseline: 140 bpm, Variability: Good {> 6 bpm) and accelerations noted Uterine activity: Irregular contractions, patient cannot feel her contractions  Dilation: 3 Effacement (%): 80 Station: -1 Exam by:: Doreatha Massed, RNC   Prenatal labs: ABO, Rh: --/--/B POS (09/10 1050) Antibody: PENDING (09/10 1050) Rubella: 5.36 (03/19 1125) RPR: Non Reactive (06/11 0910)  HBsAg: Negative (03/19 1125)  HIV: Non Reactive (06/11 0910)  GBS: Positive (08/13 0320)  2 hr GTT 63/101/98  Prenatal Transfer Tool  Maternal Diabetes: No Genetic Screening: Normal Maternal Ultrasounds/Referrals: Normal Fetal Ultrasounds or other Referrals:  None Maternal Substance Abuse:  No Significant Maternal Medications:  None Significant Maternal Lab Results: Group B Strep positive  Results for orders placed or performed during the hospital encounter of 06/19/19 (from the past 24 hour(s))  POCT fern test   Collection Time: 06/19/19 10:31 AM  Result Value Ref Range   POCT Fern Test Positive = ruptured amniotic membanes   CBC   Collection Time: 06/19/19 10:43 AM  Result Value Ref Range   WBC 16.2 (H) 4.0 - 10.5 K/uL   RBC 4.15 3.87 - 5.11 MIL/uL   Hemoglobin 9.9 (L) 12.0 - 15.0 g/dL   HCT 74.1 (L) 63.8 - 45.3 %   MCV 79.0 (L) 80.0 - 100.0 fL   MCH 23.9 (L) 26.0 - 34.0 pg   MCHC 30.2 30.0 - 36.0 g/dL   RDW 64.6 (H) 80.3 - 21.2 %   Platelets 349 150 - 400 K/uL   nRBC 0.1 0.0 - 0.2 %  Type and screen   Collection Time: 06/19/19 10:50 AM  Result Value Ref Range   ABO/RH(D) B POS    Antibody Screen PENDING    Sample Expiration      06/22/2019,2359 Performed at [facility], [address] [place], [place] [address]     Patient Active Problem List   Diagnosis Date Noted  . Encounter for induction of labor 06/19/2019  . Group B Streptococcus carrier, antepartum 05/25/2019  . Gastroesophageal reflux during pregnancy, antepartum 04/17/2019  . Late prenatal care affecting  pregnancy in second trimester 12/26/2018  . Supervision of normal first pregnancy 12/10/2018    Assessment: Jacqueline Mitchell is a 23 y.o. G1P0 at [redacted]w[redacted]d here for SROM at term.Membranes ruptured for 10 hours prior to admission.  1. Labor: IOL w/pitocin due to post-term pregnancy 2. FWB: Category 1 3. Pain: Well controlled with PRN acetaminophen, oxy 4. GBS: positive   Plan: 1. IV pitocin for IOL after 1 dose of PCN, cephalic presentation confirmed by [name] 2. continue to monitor;  encourage ambulation as tolerated 3. manage pain with PRN, epidural if desires 4. IV penicillin due to GBS positive status     Lenon OmsNigel A Brinson, Medical Student  06/19/2019, 11:43 AM    I confirm that I have verified the information documented in the resident's note and that I have also personally reviewed the history, physical exam and all medical decision making activities of this service and have verified that all service and findings are accurately documented in this student's note.   Will augment labor with pitocin after completion of PCN loading dose.  Gerrit Heckmly, Alister Staver, CNM 06/19/2019 12:33 PM

## 2019-06-20 ENCOUNTER — Encounter: Payer: Medicaid Other | Admitting: Medical

## 2019-06-20 ENCOUNTER — Encounter (HOSPITAL_COMMUNITY): Payer: Self-pay

## 2019-06-20 DIAGNOSIS — O99824 Streptococcus B carrier state complicating childbirth: Secondary | ICD-10-CM

## 2019-06-20 DIAGNOSIS — Z3A40 40 weeks gestation of pregnancy: Secondary | ICD-10-CM

## 2019-06-20 DIAGNOSIS — O480 Post-term pregnancy: Secondary | ICD-10-CM

## 2019-06-20 LAB — RPR: RPR Ser Ql: NONREACTIVE

## 2019-06-20 MED ORDER — ONDANSETRON HCL 4 MG/2ML IJ SOLN: 4.0000 mg | INTRAMUSCULAR | Status: DC | PRN

## 2019-06-20 MED ORDER — ONDANSETRON HCL 4 MG PO TABS: 4.0000 mg | ORAL_TABLET | ORAL | Status: DC | PRN

## 2019-06-20 MED ORDER — ACETAMINOPHEN 325 MG PO TABS: 650.0000 mg | ORAL_TABLET | ORAL | Status: DC | PRN

## 2019-06-20 MED ORDER — BENZOCAINE-MENTHOL 20-0.5 % EX AERO
1.0000 "application " | INHALATION_SPRAY | CUTANEOUS | Status: DC | PRN
  Administered 2019-06-20: 1 via TOPICAL
  Filled 2019-06-20: qty 56

## 2019-06-20 MED ORDER — ZOLPIDEM TARTRATE 5 MG PO TABS: 5.0000 mg | ORAL_TABLET | Freq: Every evening | ORAL | Status: DC | PRN

## 2019-06-20 MED ORDER — OXYCODONE HCL 5 MG PO TABS: 5.0000 mg | ORAL_TABLET | ORAL | Status: DC | PRN

## 2019-06-20 MED ORDER — DIBUCAINE (PERIANAL) 1 % EX OINT: 1.0000 "application " | TOPICAL_OINTMENT | CUTANEOUS | Status: DC | PRN

## 2019-06-20 MED ORDER — COCONUT OIL OIL: 1.0000 "application " | TOPICAL_OIL | Status: DC | PRN

## 2019-06-20 MED ORDER — DIPHENHYDRAMINE HCL 25 MG PO CAPS: 25.0000 mg | ORAL_CAPSULE | Freq: Four times a day (QID) | ORAL | Status: DC | PRN

## 2019-06-20 MED ORDER — IBUPROFEN 600 MG PO TABS
600.0000 mg | ORAL_TABLET | Freq: Four times a day (QID) | ORAL | Status: DC
  Administered 2019-06-20 – 2019-06-22 (×7): 600 mg via ORAL
  Filled 2019-06-20 (×7): qty 1

## 2019-06-20 MED ORDER — SIMETHICONE 80 MG PO CHEW: 80.0000 mg | CHEWABLE_TABLET | ORAL | Status: DC | PRN

## 2019-06-20 MED ORDER — WITCH HAZEL-GLYCERIN EX PADS: 1.0000 "application " | MEDICATED_PAD | CUTANEOUS | Status: DC | PRN

## 2019-06-20 MED ORDER — PRENATAL MULTIVITAMIN CH: 1.0000 | ORAL_TABLET | Freq: Every day | ORAL | Status: DC

## 2019-06-20 MED ORDER — MEDROXYPROGESTERONE ACETATE 150 MG/ML IM SUSP: 150.0000 mg | INTRAMUSCULAR | Status: DC | PRN

## 2019-06-20 MED ORDER — BENZOCAINE-MENTHOL 20-0.5 % EX AERO: 1.0000 "application " | INHALATION_SPRAY | CUTANEOUS | Status: DC | PRN

## 2019-06-20 MED ORDER — LACTATED RINGERS AMNIOINFUSION
INTRAVENOUS | Status: DC
  Administered 2019-06-20: via INTRAUTERINE

## 2019-06-20 MED ORDER — IBUPROFEN 600 MG PO TABS: 600.0000 mg | ORAL_TABLET | Freq: Four times a day (QID) | ORAL | Status: DC

## 2019-06-20 MED ORDER — PRENATAL MULTIVITAMIN CH
1.0000 | ORAL_TABLET | Freq: Every day | ORAL | Status: DC
  Administered 2019-06-21: 12:00:00 1 via ORAL
  Filled 2019-06-20: qty 1

## 2019-06-20 MED ORDER — SENNOSIDES-DOCUSATE SODIUM 8.6-50 MG PO TABS: 2.0000 | ORAL_TABLET | ORAL | Status: DC

## 2019-06-20 MED ORDER — SENNOSIDES-DOCUSATE SODIUM 8.6-50 MG PO TABS
2.0000 | ORAL_TABLET | ORAL | Status: DC
  Administered 2019-06-21: 2 via ORAL
  Filled 2019-06-20: qty 2

## 2019-06-20 NOTE — Discharge Summary (Signed)
Postpartum Discharge Summary     Patient Name: Jacqueline Mitchell DOB: [birth year] MRN: [id number]  Date of admission: 06/19/2019 Delivering Provider: Clarnce Flock   Date of discharge: 06/22/2019  Admitting diagnosis: 40WKS,LEAKING FLUID Intrauterine pregnancy: [redacted]w[redacted]d    Secondary diagnosis:  Active Problems:   Encounter for induction of labor   [redacted] weeks gestation of pregnancy   Vacuum-assisted vaginal delivery  Additional problems: None     Discharge diagnosis: Term Pregnancy Delivered                                                                                                Post partum procedures:None  Augmentation: Pitocin  Complications: RYEM>33hours  Hospital course:  Onset of Labor With Vaginal Delivery     23y.o. yo G1P0 at 465w4das admitted in Latent Labor on 06/19/2019. Patient had an uncomplicated labor course as follows: Arrived after SROM at 3 cm and started on pitocin, achieved full dilation but after brief period of pushing significant decels prompting prolonged period of laboring down. Subsequently was at +3 station and resumed pushing but again with prolonged late decelerations, vacuum was applied and head delivered with one push, remainder of delivery uncomplicated.  Membrane Rupture Time/Date: 1:30 AM ,06/19/2019   Intrapartum Procedures: Episiotomy:                                          Lacerations:  2nd degree [3];Periurethral [8]  Patient had a delivery of a Viable infant. 06/20/2019  Information for the patient's newborn:  MeShelvy, Heckertirl MeZanai0[[id number]]Delivery Method: Vag-Spont     Pateint had an uncomplicated postpartum course.  She is ambulating, tolerating a regular diet, passing flatus, and urinating well. Patient is discharged home in stable condition on 06/22/19. Ferrous sulfate prescribed on discharge. Patient opted to have Depo at outpatient appointment.   Delivery time: 8:44 AM    Magnesium Sulfate received: No BMZ received:  No Rhophylac:N/A MMR:N/A Transfusion:No  Physical exam  Vitals:   06/21/19 0500 06/21/19 1530 06/21/19 2208 06/22/19 0545  BP: 110/68 104/71 97/61 101/69  Pulse: 87 83 90 94  Resp: _0 Temp: 98.4 F (36.9 C) 97.8 F (36.6 C) 98.5 F (36.9 C) 98.2 F (36.8 C)  TempSrc: Oral  Oral Oral  SpO2:   100% 100%  Weight:      Height:       General: alert, cooperative and no distress Lochia: appropriate Uterine Fundus: firm DVT Evaluation: No evidence of DVT seen on physical exam. Labs: Lab Results  Component Value Date   WBC 16.2 (H) 06/19/2019   HGB 9.9 (L) 06/19/2019   HCT 32.8 (L) 06/19/2019   MCV 79.0 (L) 06/19/2019   PLT 349 06/19/2019   No flowsheet data found.  Discharge instruction: per After Visit Summary and "Baby and Me Booklet".  After visit meds:  Allergies as of 06/22/2019   No Known Allergies     Medication List    STOP taking  these medications   ondansetron 4 MG tablet Commonly known as: Zofran     TAKE these medications   acetaminophen 325 MG tablet Commonly known as: Tylenol Take 2 tablets (650 mg total) by mouth every 4 (four) hours as needed (for pain scale < 4).   Butalbital-APAP-Caffeine 50-325-40 MG capsule Take 1-2 capsules by mouth every 6 (six) hours as needed for headache.   ferrous sulfate 325 (65 FE) MG tablet Take 1 tablet (325 mg total) by mouth daily with breakfast.   ibuprofen 600 MG tablet Commonly known as: ADVIL Take 1 tablet (600 mg total) by mouth every 6 (six) hours.   senna 8.6 MG Tabs tablet Commonly known as: SENOKOT Take 1 tablet (8.6 mg total) by mouth daily as needed for mild constipation.   Vitafol Gummies 3.33-0.333-34.8 MG Chew Chew 3 tablets by mouth daily.       Diet: routine diet  Activity: Advance as tolerated. Pelvic rest for 6 weeks.   Outpatient follow up:6 weeks Follow up Appt: Future Appointments  Date Time Provider [place]  07/28/2019 10:15 AM Left[place]-Kirby, Kathie Dike,  CNM [facility] None   Follow up Visit:  Please schedule this patient for Postpartum visit in: 6 weeks with the following provider: Any provider Low risk pregnancy complicated by: n/a Delivery mode:  Vacuum Anticipated Birth Control:  Depo outpatient PP Procedures needed: Pap  Schedule Integrated BH visit: no   Newborn Data: Live born female  Birth Weight: 3385 g APGAR: 7, 9  Newborn Delivery   Birth date/time: 06/20/2019 08:44:00 Delivery type: Vaginal, Vacuum (Extractor)      Baby Feeding: Bottle Disposition:home with mother   06/22/2019 Chauncey Mann, MD

## 2019-06-20 NOTE — Anesthesia Postprocedure Evaluation (Signed)
Anesthesia Post Note  Patient: Marisol Valtierra  Procedure(s) Performed: AN AD HOC LABOR EPIDURAL     Patient location during evaluation: Mother Baby Anesthesia Type: Epidural Level of consciousness: awake, oriented and awake and alert Pain management: pain level controlled Vital Signs Assessment: post-procedure vital signs reviewed and stable Respiratory status: spontaneous breathing Cardiovascular status: blood pressure returned to baseline Postop Assessment: no headache, no backache, patient able to bend at knees, no apparent nausea or vomiting, able to ambulate and adequate PO intake Anesthetic complications: no    Last Vitals:  Vitals:   06/20/19 1125 06/20/19 1300  BP: (!) 89/61 (P) 109/68  Pulse: 88 (P) 70  Resp: 16 (P) 16  Temp: 36.7 C (P) 36.8 C  SpO2:      Last Pain:  Vitals:   06/20/19 1125  TempSrc: Oral  PainSc: 0-No pain   Pain Goal:                   Kathie Rhodes

## 2019-06-20 NOTE — Plan of Care (Signed)
Jacqueline Mitchell has not been able to void as of reporting off.  She has no urge or did not recognize her bladder was full.  She attempted to void x 3 with no success. After bladder scan, she was I & O x 1 with 900 out

## 2019-06-20 NOTE — Lactation Note (Signed)
This note was copied from a baby's chart. Lactation Consultation Note  Patient Name: Jacqueline Mitchell [id number] Date: 06/20/2019 Reason for consult: Initial assessment;Primapara;1st time breastfeeding;Term  48 hours old FT female who is being partially BF and formula fed by her mother, she's a P1. Mom reported (+) breast changes during the pregnancy. She participated in the [facility] program at the [facility] and she's already familiar with hand expression. When LC revised hand expression with mom noticed that her nipples were short shafted but her tissue is very compressible. She didn't bring a nursing bra to the hospital.   Mom doesn't have a pump at home, [facility] issued a hand pump, instructions, cleaning and storage were reviewed as well as milk storage guidelines. Mom has plenty of colostrum pouring off both breasts, encouraged her to use it first before offering baby any amount of formula. Mom came as breast/formula and she already started supplementing baby with Gerber gentle.   Offered assistance with latch but mom declined, baby was asleep. Asked mom to call for assistance when needed. Reviewed normal newborn behavior and feeding cues. Reinforced the concept that baby needs to be taken to the breast on cues instead of on schedule.  Feeding plan:  1. Encouraged mom to feed baby STS 8-12 times/24 hours or sooner if feeding cues are present 2. Hand expression and spoon feeding were also encouraged 3. Mom will try pre-pumping prior feedings to help everting her nipples, and if any EBM is available, she'll use it first before any formula 4. Mom will continue supplementing baby with Gerber gentle per feeding choice on admission  BF brochure, BF resources and feeding diary were reviewed. Parents reported all questions and concerns were answered, they're both aware of [facility] OP services and will call PRN.  Maternal Data Formula Feeding for Exclusion: Yes Reason for exclusion: Mother's choice to formula and breast feed  on admission Has patient been taught Hand Expression?: Yes Does the patient have breastfeeding experience prior to this delivery?: No  Feeding Feeding Type: Formula Nipple Type: Slow - flow   Interventions Interventions: Breast feeding basics reviewed;Hand express;Hand pump;Pre-pump if needed  Lactation Tools Discussed/Used Tools: Pump Breast pump type: Manual WIC Program: Yes Pump Review: Setup, frequency, and cleaning;Milk Storage Initiated by:: MPeck Date initiated:: 06/20/19   Consult Status Consult Status: Follow-up Date: 06/21/19 Follow-up type: In-patient    Jacqueline Mitchell Jacqueline Mitchell 06/20/2019, 6:42 PM

## 2019-06-20 NOTE — Progress Notes (Signed)
This note also relates to the following rows which could not be included: SpO2 - Cannot attach notes to unvalidated device data  Viable female infant delivered at this time, see delivery record

## 2019-06-20 NOTE — Progress Notes (Signed)
Jacqueline Mitchell is a 23 y.o. G1P0 at [redacted]w[redacted]d admitted for rupture of membranes  Subjective: Comfortable with Epidural, Pit continues at 10. FHR continues to have late decels, but moderate variability and +accels  Objective: BP (!) 95/59   Pulse 85   Temp 98.2 F (36.8 C) (Oral)   Resp 17   Ht 5\' 2"  (1.575 m)   Wt 68.5 kg   LMP 08/03/2018 (Within Weeks)   SpO2 99%   BMI 27.62 kg/m  No intake/output data recorded.  FHT:  FHR: 135 bpm, variability: moderate,  accelerations:  Present,  decelerations:  Present (late) UC:   irregular, every 2-5 minutes  SVE:   Dilation: 4 Effacement (%): 50 Station: -1 Exam by:: [place]   Pitocin @ 10 mu/min  Labs: Lab Results  Component Value Date   WBC 16.2 (H) 06/19/2019   HGB 9.9 (L) 06/19/2019   HCT 32.8 (L) 06/19/2019   MCV 79.0 (L) 06/19/2019   PLT 349 06/19/2019    Assessment / Plan: 23 yo G1P0 at [redacted]w[redacted]d admitted for SROM in early labor  Labor: SROM, Pitocin at 10. Continues to have some late decels with moderate variability. Good contraction pattern. IUPC placed with amnioinfusion (337mL bolus, rate at 100 ml/hr) Fetal Wellbeing:  Category II, fetal status reassuring with moderate variability/+accels/normal baseline (Dr. Elly Modena made aware) Pain Control:  Epidural I/D:  GBS+, cont PCN Anticipated MOD:  SVD, CS as appropriate  Merilyn Baba DO OB Fellow, Faculty Practice 06/20/2019, 12:05 AM

## 2019-06-20 NOTE — Progress Notes (Signed)
Jacqueline Mitchell is a 23 y.o. G1P0 at [redacted]w[redacted]d admitted for rupture of membranes  Subjective: Comfortable with epidural, no urge to push. Feeling pressure with contractions. Amnioinfusion in place.  Objective: BP 113/71   Pulse (!) 102   Temp 98.2 F (36.8 C) (Oral)   Resp 17   Ht 5\' 2"  (1.575 m)   Wt 68.5 kg   LMP 08/03/2018 (Within Weeks)   SpO2 99%   BMI 27.62 kg/m  Total I/O In: -  Out: 850 [Urine:850]  FHT:  FHR: 120 bpm, variability: moderate,  accelerations:  Present,  decelerations:  Present (occasional early, occasional lates, occasional variables) UC:   regular, every 2-3 minutes  SVE:   Dilation: 10 Effacement (%): 100 Station: 0 Exam by:: Dimple Bastyr   Pitocin @ 12 mu/min  Labs: Lab Results  Component Value Date   WBC 16.2 (H) 06/19/2019   HGB 9.9 (L) 06/19/2019   HCT 32.8 (L) 06/19/2019   MCV 79.0 (L) 06/19/2019   PLT 349 06/19/2019    Assessment / Plan: 23 yo G1P0 at [redacted]w[redacted]d admitted for SROM in early labor  Labor: Prolonged accels right after I left the room. They resolved with scalp stimulation. Patient tried to push for ~30-40 minutes, and Dr. Elly Modena was called to the room due to prolonged decels. These also resolved with scalp stimulation. Maternal pushing efforts were fair, but station is still 0/+1 with contractions. Plan to labor down. Fetal Wellbeing:  Category II, a few prolonged decels which resolved with scalp stimulation. Accels present. Pain Control:  Epidural I/D:  GBS+, on PCN Anticipated MOD:  vaginal delivery  Dan Europe Chenelle Benning DO OB Fellow, Faculty Practice 06/20/2019, 4:10 AM

## 2019-06-20 NOTE — Plan of Care (Signed)
Patient progressed to complete and delivered infant vaginally.

## 2019-06-21 ENCOUNTER — Other Ambulatory Visit (HOSPITAL_COMMUNITY)
Admission: RE | Admit: 2019-06-21 | Discharge: 2019-06-21 | Disposition: A | Discharge status: Home / Self Care | Payer: Medicaid Other | Source: Ambulatory Visit

## 2019-06-21 MED ORDER — SENNA 8.6 MG PO TABS: 1.0000 | ORAL_TABLET | Freq: Every day | ORAL | 0 refills | Status: DC | PRN

## 2019-06-21 MED ORDER — IBUPROFEN 600 MG PO TABS: 600.0000 mg | ORAL_TABLET | Freq: Four times a day (QID) | ORAL | 0 refills | Status: DC

## 2019-06-21 MED ORDER — ACETAMINOPHEN 325 MG PO TABS: 650.0000 mg | ORAL_TABLET | ORAL | 0 refills | Status: DC | PRN

## 2019-06-21 MED ORDER — INFLUENZA VAC SPLIT QUAD 0.5 ML IM SUSY
0.5000 mL | PREFILLED_SYRINGE | INTRAMUSCULAR | Status: DC
  Filled 2019-06-21: qty 0.5

## 2019-06-21 MED ORDER — INFLUENZA VAC A&B SA ADJ QUAD 0.5 ML IM PRSY: 0.5000 mL | PREFILLED_SYRINGE | INTRAMUSCULAR | Status: DC

## 2019-06-21 NOTE — Progress Notes (Addendum)
Post Partum Day 1  Subjective:   Patient was examined at the bedside this morning. She endorses some lower abdominal pain but claims it is well controlled with her tylenol and ibuprofen. Patient had a 2nd degree tear during VAVD but does not endorse pain around site of laceration. Notes red-tinged lochia similar to her normal menstrual cycle. She does not note clots. Able to tolerate her current PO diet. Has urinated since her delivery but is yet to have a bowel movement. Encouraged patient to continue ambulating. She had no questions or concerns.   She denies lightheadedness, headache, dyspnea, chest pain, palpitations or abdominal cramping.  Her plan is to breast feed her baby girl. Lactation nurses visited her last night. Lastly, she would like a depo shot for contraception prior to discharge.  Objective: Blood pressure 110/68, pulse 87, temperature 98.4 F (36.9 C), temperature source Oral, resp. rate 18, height 5\' 2"  (1.575 m), weight 68.5 kg, last menstrual period 08/03/2018, SpO2 97 %, unknown if currently breastfeeding.  Physical Exam:  General: alert, cooperative, appears stated age and no distress HEENT: No scleral icterus  Lochia: appropriate. No clots  Cardio: RRR, normal s1, s2 Pulm: CTAB, normal work of breathing Abd: Soft abdomen, NABS, slight tenderness to light palpation Uterine Fundus: firm DVT Evaluation: No evidence of DVT seen on physical exam. No significant calf/ankle edema.  Recent Labs    06/19/19 1043  HGB 9.9*  HCT 32.8*    Assessment/Plan: Plan for discharge tomorrow, Breastfeeding, Lactation consult and Contraception Depo    Tyne Schopf is a 23 y.o G1P1001 who is PPD#1 s/p VAVD following SROM. Patient is doing well. - Continue to monitor; pain well controlled  - Pending depo for contraception  - Breastfeeding; lactation nurse signed on - Patient received TDAP 03/20/19   LOS: 2 days   Nikki Dom Driver 0/94/7096, 2:83 AM   I confirm that I have  verified the information documented in the resident's note and that I have also personally reperformed the history, physical exam and all medical decision making activities of this service and have verified that all service and findings are accurately documented in this student's note.   Clarisa Fling, NP 06/21/2019 1:54 PM

## 2019-06-21 NOTE — Lactation Note (Signed)
This note was copied from a baby's chart. Lactation Consultation Note  Patient Name: Jacqueline Mitchell [id number] Date: 06/21/2019 Reason for consult: Follow-up assessment  P1 mother whose infant is now 29 hours old.  Mother's feeding preference is breast/bottle.    Mother has not attempted to breast feed since 2300 last night.  When questioned about her breast feeding mother stated, "I'm not breast feeding cuz nothing's coming out."  Educated mother on breast feeding basics including breast stimulation and frequent latching and feeding to help establish and maintain a good milk supply.  Discussed milk coming to volume and hand expression.  Offered to assist mother with the next feeding if she would like my help.  Father present and sleeping.  RN updated.   Maternal Data    Feeding Feeding Type: Bottle Fed - Formula  LATCH Score                   Interventions    Lactation Tools Discussed/Used     Consult Status Consult Status: Follow-up Date: 06/22/19 Follow-up type: In-patient    Jacqueline Mitchell 06/21/2019, 4:01 PM

## 2019-06-21 NOTE — Progress Notes (Signed)
Post Partum Day 1 Subjective: no complaints, up ad lib, voiding, tolerating PO and + flatus Pt requesting flu vaccine, order entered. Pt requests to stay today as baby is not getting discharged until tomorrow.  Objective: Blood pressure 110/68, pulse 87, temperature 98.4 F (36.9 C), temperature source Oral, resp. rate 18, height 5\' 2"  (1.575 m), weight 68.5 kg, last menstrual period 08/03/2018, SpO2 97 %, unknown if currently breastfeeding.  Physical Exam:  General: alert, cooperative and no distress Lochia: appropriate Uterine Fundus: firm Incision: N/A DVT Evaluation: No evidence of DVT seen on physical exam. No cords or calf tenderness. No significant calf/ankle edema.  Recent Labs    06/19/19 1043  HGB 9.9*  HCT 32.8*    Assessment/Plan: Plan for discharge tomorrow, Breastfeeding and Contraception Depo, flu shot.   LOS: 2 days   Gerrie Nordmann Nugent 06/21/2019, 1:53 PM

## 2019-06-22 DIAGNOSIS — Z3A40 40 weeks gestation of pregnancy: Secondary | ICD-10-CM

## 2019-06-22 MED ORDER — FERROUS SULFATE 325 (65 FE) MG PO TABS: 325.0000 mg | ORAL_TABLET | Freq: Every day | ORAL | 3 refills | Status: DC

## 2019-06-22 MED ORDER — FERROUS SULFATE 325 (65 FE) MG PO TABS
325.0000 mg | ORAL_TABLET | Freq: Every day | ORAL | Status: DC
  Administered 2019-06-22: 10:00:00 325 mg via ORAL
  Filled 2019-06-22: qty 1

## 2019-06-22 NOTE — Lactation Note (Signed)
This note was copied from a baby's chart. Lactation Consultation Note  Patient Name: Jacqueline Mitchell Date: 06/22/2019   Baby 19 hours old and primarily formula feeding. Offered interpreter and assistance with breastfeeding and mother declined. Discussed supply and demand with breastfeeding.  Mother states she wants to give formula while in school and breastfeed on the weekends. Encouraged mother to consider pumping while taking breaks during school. Mother seems unsure of how she wants to feed her baby.  Reviewed engorgement care, cabbage leaves if needed. and monitoring voids/stools.      Maternal Data    Feeding Feeding Type: Bottle Fed - Formula Nipple Type: Slow - flow  LATCH Score                   Interventions    Lactation Tools Discussed/Used     Consult Status      Carlye Grippe 06/22/2019, 9:13 AM

## 2019-06-23 ENCOUNTER — Other Ambulatory Visit: Payer: Self-pay

## 2019-06-23 ENCOUNTER — Inpatient Hospital Stay (HOSPITAL_COMMUNITY)
Admission: EM | Admit: 2019-06-23 | Discharge: 2019-06-23 | Disposition: A | Discharge status: Home / Self Care | Payer: Medicaid Other | Attending: Obstetrics & Gynecology | Admitting: Obstetrics & Gynecology

## 2019-06-23 ENCOUNTER — Inpatient Hospital Stay (HOSPITAL_COMMUNITY): Payer: Medicaid Other

## 2019-06-23 ENCOUNTER — Encounter (HOSPITAL_COMMUNITY): Payer: Self-pay | Admitting: *Deleted

## 2019-06-23 ENCOUNTER — Inpatient Hospital Stay (HOSPITAL_COMMUNITY): Admission: AD | Admit: 2019-06-23 | Payer: Medicaid Other | Source: Home / Self Care | Admitting: Obstetrics

## 2019-06-23 DIAGNOSIS — R1010 Upper abdominal pain, unspecified: Secondary | ICD-10-CM | POA: Insufficient documentation

## 2019-06-23 DIAGNOSIS — O9089 Other complications of the puerperium, not elsewhere classified: Secondary | ICD-10-CM | POA: Insufficient documentation

## 2019-06-23 DIAGNOSIS — R519 Headache, unspecified: Secondary | ICD-10-CM

## 2019-06-23 DIAGNOSIS — O1205 Gestational edema, complicating the puerperium: Secondary | ICD-10-CM | POA: Insufficient documentation

## 2019-06-23 DIAGNOSIS — R51 Headache: Secondary | ICD-10-CM | POA: Diagnosis not present

## 2019-06-23 DIAGNOSIS — R1011 Right upper quadrant pain: Secondary | ICD-10-CM

## 2019-06-23 DIAGNOSIS — R1012 Left upper quadrant pain: Secondary | ICD-10-CM

## 2019-06-23 LAB — CBC
HCT: 27.1 % — ABNORMAL LOW (ref 36.0–46.0)
Hemoglobin: 8.1 g/dL — ABNORMAL LOW (ref 12.0–15.0)
MCH: 23.6 pg — ABNORMAL LOW (ref 26.0–34.0)
MCHC: 29.9 g/dL — ABNORMAL LOW (ref 30.0–36.0)
MCV: 79 fL — ABNORMAL LOW (ref 80.0–100.0)
Platelets: 306 10*3/uL (ref 150–400)
RBC: 3.43 MIL/uL — ABNORMAL LOW (ref 3.87–5.11)
RDW: 16.1 % — ABNORMAL HIGH (ref 11.5–15.5)
WBC: 12.6 10*3/uL — ABNORMAL HIGH (ref 4.0–10.5)
nRBC: 0 % (ref 0.0–0.2)

## 2019-06-23 LAB — COMPREHENSIVE METABOLIC PANEL
ALT: 8 U/L (ref 0–44)
AST: 25 U/L (ref 15–41)
Albumin: 2.4 g/dL — ABNORMAL LOW (ref 3.5–5.0)
Alkaline Phosphatase: 91 U/L (ref 38–126)
Anion gap: 8 (ref 5–15)
BUN: 13 mg/dL (ref 6–20)
CO2: 26 mmol/L (ref 22–32)
Calcium: 8.8 mg/dL — ABNORMAL LOW (ref 8.9–10.3)
Chloride: 106 mmol/L (ref 98–111)
Creatinine, Ser: 0.78 mg/dL (ref 0.44–1.00)
GFR calc Af Amer: >60 mL/min (ref >60)
GFR calc non Af Amer: >60 mL/min (ref >60)
Glucose, Bld: 80 mg/dL (ref 70–99)
Potassium: 3.4 mmol/L — ABNORMAL LOW (ref 3.5–5.1)
Sodium: 140 mmol/L (ref 135–145)
Total Bilirubin: 0.4 mg/dL (ref 0.3–1.2)
Total Protein: 6 g/dL — ABNORMAL LOW (ref 6.5–8.1)

## 2019-06-23 LAB — PROTEIN / CREATININE RATIO, URINE
Creatinine, Urine: 24.59 mg/dL
Total Protein, Urine: <6 mg/dL

## 2019-06-23 MED ORDER — BUTALBITAL-APAP-CAFFEINE 50-325-40 MG PO TABS
2.0000 | ORAL_TABLET | Freq: Once | ORAL | Status: AC
  Administered 2019-06-23: 2 via ORAL
  Filled 2019-06-23: qty 2

## 2019-06-23 NOTE — Discharge Instructions (Signed)

## 2019-06-23 NOTE — MAU Note (Addendum)
Got discharged yesterday and at 3 pm, noticed swelling in legs and feet and then at 10 pm swelling in face, lips.  Was full term, vaginal delivery, denies blood pressure issues with pregnancy.  When arrived at [place], started having headache and upper abd pain on right and left side "under my breast".

## 2019-06-23 NOTE — MAU Provider Note (Addendum)
History     CSN: [id number]  Arrival date and time: 06/23/19 0153   First Provider Initiated Contact with Patient 06/23/19 [phone]      Chief Complaint  Patient presents with  . feet and ankle swelling    Jacqueline Mitchell is a 23 y.o. G1P1001 at 3 days postpartum who receives care at [facility].  She presents today for feet and ankle swelling.  She reports that she noticed the swelling after being discharged yesterday.  She states that she has been ambulating, but has not elevated her feet.  Patient also reports drinking about (3) 16 oz bottles of water yesterday.  She reports sharp pain in her upper abdomen that is constant and started around 12am.  She states the pain is not aggravated or relieved by any factors, while rating the pain a 6/10.  She reports eating around 6pm this evening and denies N/V.  Patient reports that she is breastfeeding and reports that it is not going well.  Patient also reports headache since about 1am and states she took ibuprofen at 12 with the onset of her upper abdominal pain with no relief.  Patient denies issues with blood pressure during pregnancy or delivery.      OB History    Gravida  1   Para  1   Term  1   Preterm      AB      Living  1     SAB      TAB      Ectopic      Multiple  0   Live Births  1           Past Medical History:  Diagnosis Date  . Medical history non-contributory     Past Surgical History:  Procedure Laterality Date  . NO PAST SURGERIES      Family History  Problem Relation Age of Onset  . Diabetes Mother   . Miscarriages / [name] Mother     Social History   Tobacco Use  . Smoking status: Never Smoker  . Smokeless tobacco: Never Used  Substance Use Topics  . Alcohol use: Not Currently    Comment: Occasional  . Drug use: Never    Allergies: No Known Allergies  Medications Prior to Admission  Medication Sig Dispense Refill Last Dose  . ibuprofen (ADVIL) 600 MG tablet Take 1 tablet (600 mg  total) by mouth every 6 (six) hours. 30 tablet 0 06/22/2019 at Unknown time  . acetaminophen (TYLENOL) 325 MG tablet Take 2 tablets (650 mg total) by mouth every 4 (four) hours as needed (for pain scale < 4). 30 tablet 0   . Butalbital-APAP-Caffeine 50-325-40 MG capsule Take 1-2 capsules by mouth every 6 (six) hours as needed for headache. (Patient not taking: Reported on 06/05/2019) 30 capsule 3   . ferrous sulfate 325 (65 FE) MG tablet Take 1 tablet (325 mg total) by mouth daily with breakfast. 60 tablet 3   . Prenatal Vit-Fe Phos-FA-Omega (VITAFOL GUMMIES) 3.33-0.333-34.8 MG CHEW Chew 3 tablets by mouth daily. 90 tablet 3   . senna (SENOKOT) 8.6 MG TABS tablet Take 1 tablet (8.6 mg total) by mouth daily as needed for mild constipation. 30 tablet 0     Review of Systems  Constitutional: Negative for chills and fever.  Respiratory: Negative for cough and shortness of breath.   Cardiovascular: Positive for leg swelling.  Gastrointestinal: Positive for abdominal pain (Bilateral Upper Quadrants). Negative for constipation, diarrhea, nausea and vomiting.  Genitourinary: Positive for vaginal bleeding. Negative for difficulty urinating and dysuria.  Neurological: Positive for headaches (Since 1am). Negative for dizziness and light-headedness.   Physical Exam   Blood pressure 108/69, pulse 85, temperature 98.5 F (36.9 C), temperature source Oral, resp. rate 18, height 5\' 2"  (1.575 m), weight 68.5 kg, SpO2 98 %, unknown if currently breastfeeding.  Physical Exam  Constitutional: She is oriented to person, place, and time. She appears well-developed and well-nourished. No distress.  HENT:  Head: Normocephalic and atraumatic.  Eyes: Conjunctivae are normal.  Neck: Normal range of motion.  Cardiovascular: Normal rate, regular rhythm and normal heart sounds.  Respiratory: Effort normal and breath sounds normal.  GI: Soft. Bowel sounds are normal. There is abdominal tenderness in the right upper  quadrant and left upper quadrant.  Musculoskeletal: Normal range of motion.        General: No edema.  Neurological: She is alert and oriented to person, place, and time.  Skin: Skin is warm and dry.  Psychiatric: She has a normal mood and affect. Her behavior is normal.    MAU Course  Procedures Results for orders placed or performed during the hospital encounter of 06/23/19 (from the past 24 hour(s))  CBC     Status: Abnormal   Collection Time: 06/23/19  3:37 AM  Result Value Ref Range   WBC 12.6 (H) 4.0 - 10.5 K/uL   RBC 3.43 (L) 3.87 - 5.11 MIL/uL   Hemoglobin 8.1 (L) 12.0 - 15.0 g/dL   HCT 40.3 (L) 47.4 - 25.9 %   MCV 79.0 (L) 80.0 - 100.0 fL   MCH 23.6 (L) 26.0 - 34.0 pg   MCHC 29.9 (L) 30.0 - 36.0 g/dL   RDW 56.3 (H) 87.5 - 64.3 %   Platelets 306 150 - 400 K/uL   nRBC 0.0 0.0 - 0.2 %  Comprehensive metabolic panel     Status: Abnormal   Collection Time: 06/23/19  3:37 AM  Result Value Ref Range   Sodium 140 135 - 145 mmol/L   Potassium 3.4 (L) 3.5 - 5.1 mmol/L   Chloride 106 98 - 111 mmol/L   CO2 26 22 - 32 mmol/L   Glucose, Bld 80 70 - 99 mg/dL   BUN 13 6 - 20 mg/dL   Creatinine, Ser 3.29 0.44 - 1.00 mg/dL   Calcium 8.8 (L) 8.9 - 10.3 mg/dL   Total Protein 6.0 (L) 6.5 - 8.1 g/dL   Albumin 2.4 (L) 3.5 - 5.0 g/dL   AST 25 15 - 41 U/L   ALT 8 0 - 44 U/L   Alkaline Phosphatase 91 38 - 126 U/L   Total Bilirubin 0.4 0.3 - 1.2 mg/dL   GFR calc non Af Amer >60 >60 mL/min   GFR calc Af Amer >60 >60 mL/min   Anion gap 8 5 - 15  Protein / creatinine ratio, urine     Status: None   Collection Time: 06/23/19  4:06 AM  Result Value Ref Range   Creatinine, Urine 24.59 mg/dL   Total Protein, Urine <6 mg/dL   Protein Creatinine Ratio        0.00 - 0.15 mg/mg[Cre]    MDM Physical Exam Pain Medication CBC, CMP, PC Ratio Assessment and Plan  23 year  3 Days Postpartum Upper Quadrant Pain Headache  -Exam findings discussed. -Informed that current edema is not  definitive upon observation and without pitting upon palpation.  -Extensive discussion regarding normalcy of edema after delivery. -Reviewed techniques to  relieve edema including increased hydration and elevation of extremities while at rest. -Offered and accepts pain medication. -Will give fiorcet 2 tablets now for HA. Informed that tylenol may cause some relief of upper quadrant pain.  -Will complete PIH labs based on unrelieved HA and upper quadrant pain despite bilateral presentation.  -No questions or concerns.  -Will await results.    Cherre RobinsJessica L Nihar Klus MSN, CNM 06/23/2019, 3:07 AM   Reassessment (4:49 AM)  -Per nurse, patient reports improvement in all pain with Fiorcet dosing. -Nurse instructed to inform patient of: *Insignificant lab results. *Continue PNV and iron supplement. *Fiorcet script, with refills, was previously prescribed and okay to take. *Reiterate normalcy of edema after delivery -Encouraged to call or return to MAU if symptoms worsen or with the onset of new symptoms. -Discharged to home in improved condition.  Cherre RobinsJessica L Khadeem Rockett MSN, CNM

## 2019-06-23 NOTE — ED Triage Notes (Signed)
The pt delikvered vaginally 9-11 yesterday afternoon her feet and ankles  And her facial feature stsrted swelling    [facility] called they will see her there

## 2019-07-28 ENCOUNTER — Ambulatory Visit: Payer: Medicaid Other | Admitting: Advanced Practice Midwife

## 2019-08-13 ENCOUNTER — Encounter: Payer: Self-pay | Admitting: Obstetrics and Gynecology

## 2019-08-13 ENCOUNTER — Other Ambulatory Visit (HOSPITAL_COMMUNITY)
Admission: RE | Admit: 2019-08-13 | Discharge: 2019-08-13 | Disposition: A | Discharge status: Home / Self Care | Payer: Medicaid Other | Source: Ambulatory Visit | Attending: Obstetrics and Gynecology | Admitting: Obstetrics and Gynecology

## 2019-08-13 ENCOUNTER — Ambulatory Visit (INDEPENDENT_AMBULATORY_CARE_PROVIDER_SITE_OTHER): Payer: Medicaid Other | Admitting: Obstetrics and Gynecology

## 2019-08-13 ENCOUNTER — Other Ambulatory Visit: Payer: Self-pay

## 2019-08-13 DIAGNOSIS — K5900 Constipation, unspecified: Secondary | ICD-10-CM

## 2019-08-13 DIAGNOSIS — Z124 Encounter for screening for malignant neoplasm of cervix: Secondary | ICD-10-CM

## 2019-08-13 DIAGNOSIS — Z3042 Encounter for surveillance of injectable contraceptive: Secondary | ICD-10-CM

## 2019-08-13 DIAGNOSIS — Z3202 Encounter for pregnancy test, result negative: Secondary | ICD-10-CM | POA: Diagnosis not present

## 2019-08-13 DIAGNOSIS — Z3009 Encounter for other general counseling and advice on contraception: Secondary | ICD-10-CM

## 2019-08-13 DIAGNOSIS — Z23 Encounter for immunization: Secondary | ICD-10-CM | POA: Diagnosis not present

## 2019-08-13 LAB — POCT URINE PREGNANCY: Preg Test, Ur: NEGATIVE

## 2019-08-13 MED ORDER — MEDROXYPROGESTERONE ACETATE 150 MG/ML IM SUSP
150.0000 mg | Freq: Once | INTRAMUSCULAR | Status: AC
  Administered 2019-08-13: 150 mg via INTRAMUSCULAR

## 2019-08-13 NOTE — Progress Notes (Signed)
Obstetrics/Postpartum Visit  Appointment Date: 08/13/2019  OBGYN Clinic: [facility]  Primary Care Provider: Patient, No Pcp Per  Chief Complaint:  Chief Complaint  Patient presents with  . Postpartum Care    History of Present Illness: Jacqueline Mitchell is a 22 y.o. Asian G1P1001 (No LMP recorded. (Menstrual status: Other).), seen for the above chief complaint. Her past medical history is significant for n/a.   She is s/p vacuum assisted vaginal delivery on 06/20/2019 at 40 weeks weeks; she was discharged to home on PPD#2. Pregnancy complicated by late prenatal care.  Complains of constipation and occasional blood in stool.  Vaginal bleeding or discharge: No  Breast or formula feeding: breast Intercourse: Yes , protected, desires to use depo Contraception: depo PP depression s/s: No  Any bowel or bladder issues: Yes  Pap smear: will complete today   Review of Systems: Positive for n/a.  Her 12 point review of systems is negative or as noted in the History of Present Illness.  Patient Active Problem List   Diagnosis Date Noted  . Gastroesophageal reflux during pregnancy, antepartum 04/17/2019  . Late prenatal care affecting pregnancy in second trimester 12/26/2018  . Supervision of normal first pregnancy 12/10/2018    Medications We administered medroxyPROGESTERone. Current Outpatient Medications  Medication Sig Dispense Refill  . ferrous sulfate 325 (65 FE) MG tablet Take 1 tablet (325 mg total) by mouth daily with breakfast. 60 tablet 3  . Prenatal Vit-Fe Phos-FA-Omega (VITAFOL GUMMIES) 3.33-0.333-34.8 MG CHEW Chew 3 tablets by mouth daily. 90 tablet 3  . acetaminophen (TYLENOL) 325 MG tablet Take 2 tablets (650 mg total) by mouth every 4 (four) hours as needed (for pain scale < 4). (Patient not taking: Reported on 08/13/2019) 30 tablet 0  . Butalbital-APAP-Caffeine 50-325-40 MG capsule Take 1-2 capsules by mouth every 6 (six) hours as needed for headache. (Patient not taking:  Reported on 06/05/2019) 30 capsule 3  . ibuprofen (ADVIL) 600 MG tablet Take 1 tablet (600 mg total) by mouth every 6 (six) hours. (Patient not taking: Reported on 08/13/2019) 30 tablet 0  . senna (SENOKOT) 8.6 MG TABS tablet Take 1 tablet (8.6 mg total) by mouth daily as needed for mild constipation. (Patient not taking: Reported on 08/13/2019) 30 tablet 0   No current facility-administered medications for this visit.     Allergies Patient has no known allergies.  Physical Exam:  BP 106/69   Pulse 77   Wt 146 lb 12.8 oz (66.6 kg)   Breastfeeding Yes   BMI 26.85 kg/m  Body mass index is 26.85 kg/m. General appearance: Well nourished, well developed female in no acute distress.  Cardiovascular: regular rate and rhythm Respiratory: Normal respiratory effort Abdomen: no masses, hernias; diffusely non tender to palpation, non distended Breasts: not examined. Neuro/Psych:  Normal mood and affect.  Skin:  Warm and dry.   Pelvic exam: is not limited by body habitus EGBUS: within normal limits Vagina: within normal limits and with None blood in the vault, Cervix:  not digitally examined, appears normal  PP Depression Screening:   Edinburgh Postnatal Depression Scale - 08/13/19 1313      Edinburgh Postnatal Depression Scale:  In the Past 7 Days   I have been able to laugh and see the funny side of things.  0    I have looked forward with enjoyment to things.  0    I have blamed myself unnecessarily when things went wrong.  0    I have been anxious  or worried for no good reason.  0    I have felt scared or panicky for no good reason.  0    Things have been getting on top of me.  0    I have been so unhappy that I have had difficulty sleeping.  0    I have felt sad or miserable.  0    I have been so unhappy that I have been crying.  0    The thought of harming myself has occurred to me.  0    Edinburgh Postnatal Depression Scale Total  0       Assessment: Patient is a 23 y.o.  G1P1001 who is 7 weeks post partum from a vacuum assisted vaginal delivery. She is doing well.   Plan:   1. Postpartum state Doing well - POCT urine pregnancy - flu shot today  2. Constipation, unspecified constipation type Encouraged increased water and fiber intake  3. Cervical cancer screening - Cytology - PAP( [facility])  4. Encounter for counseling regarding contraception Reviewed options for contraception, pt interested in depo, reviewed risks/benefits, she will get depo today (intercourse one week ago but was protected), will call if she desires to switch to POPs   RTC 3 months for depo   K. Therese Sarah, M.D. Attending Center for [employer] [facility])

## 2019-08-18 LAB — CYTOLOGY - PAP: Diagnosis: NEGATIVE

## 2019-10-24 ENCOUNTER — Encounter (HOSPITAL_COMMUNITY): Payer: Self-pay | Admitting: Obstetrics and Gynecology

## 2019-11-06 ENCOUNTER — Other Ambulatory Visit: Payer: Self-pay

## 2019-11-06 ENCOUNTER — Ambulatory Visit (INDEPENDENT_AMBULATORY_CARE_PROVIDER_SITE_OTHER): Payer: Medicaid Other

## 2019-11-06 DIAGNOSIS — Z3042 Encounter for surveillance of injectable contraceptive: Secondary | ICD-10-CM | POA: Diagnosis not present

## 2019-11-06 MED ORDER — MEDROXYPROGESTERONE ACETATE 150 MG/ML IM SUSP
150.0000 mg | INTRAMUSCULAR | Status: DC
  Administered 2019-11-06: 150 mg via INTRAMUSCULAR

## 2019-11-06 MED ORDER — MEDROXYPROGESTERONE ACETATE 150 MG/ML IM SUSP: 150.0000 mg | INTRAMUSCULAR | 3 refills | Status: DC

## 2019-11-06 NOTE — Progress Notes (Signed)
Pt is in the office for depo, administered in [facility] and pt tolerated well. Next due April 15-29. .. Administrations This Visit    medroxyPROGESTERone (DEPO-PROVERA) injection 150 mg    Admin Date 11/06/2019 Action Given Dose 150 mg Route Intramuscular Administered By Katrina Stack, RN

## 2019-11-06 NOTE — Progress Notes (Signed)
I have reviewed this chart and agree with the RN/CMA assessment and management.    K. Meryl Lagina Reader, M.D. Attending [facility]ulty [facility])   

## 2020-01-29 ENCOUNTER — Ambulatory Visit: Payer: Medicaid Other

## 2020-05-06 ENCOUNTER — Ambulatory Visit (HOSPITAL_COMMUNITY)
Admission: EM | Admit: 2020-05-06 | Discharge: 2020-05-06 | Disposition: A | Discharge status: Home / Self Care | Payer: Medicaid Other | Attending: Internal Medicine | Admitting: Internal Medicine

## 2020-05-06 ENCOUNTER — Other Ambulatory Visit: Payer: Self-pay

## 2020-05-06 DIAGNOSIS — L255 Unspecified contact dermatitis due to plants, except food: Secondary | ICD-10-CM

## 2020-05-06 MED ORDER — HYDROXYZINE HCL 25 MG PO TABS: 25.0000 mg | ORAL_TABLET | Freq: Three times a day (TID) | ORAL | 0 refills | Status: DC | PRN

## 2020-05-06 MED ORDER — PREDNISONE 20 MG PO TABS: 40.0000 mg | ORAL_TABLET | Freq: Every day | ORAL | 0 refills | Status: AC

## 2020-05-06 NOTE — ED Provider Notes (Signed)
[facility]    CSN: [id number] Arrival date & time: 05/06/20  9935      History   Chief Complaint Chief Complaint  Patient presents with  . Rash    HPI Jacqueline Mitchell is a 24 y.o. female comes to the urgent care with a 4-day history of rash over the left forearm.  The rash started about 3 to 4 days ago and the day after she went to a garden to pick some fruits.  She is not aware of coming into contact with poison ivy.  No fever or chills.  The rash is beginning to form blisters and continues to be pruritic.  The rash has spread to few areas on the face.  She admits to scratching her face.  No sick contacts. HPI  Past Medical History:  Diagnosis Date  . Medical history non-contributory     Patient Active Problem List   Diagnosis Date Noted  . Gastroesophageal reflux during pregnancy, antepartum 04/17/2019  . Late prenatal care affecting pregnancy in second trimester 12/26/2018  . Supervision of normal first pregnancy 12/10/2018    Past Surgical History:  Procedure Laterality Date  . NO PAST SURGERIES      OB History    Gravida  1   Para  1   Term  1   Preterm      AB      Living  1     SAB      TAB      Ectopic      Multiple  0   Live Births  1            Home Medications    Prior to Admission medications   Medication Sig Start Date End Date Taking? Authorizing Provider  hydrOXYzine (ATARAX/VISTARIL) 25 MG tablet Take 1 tablet (25 mg total) by mouth every 8 (eight) hours as needed for itching. 05/06/20   Neylan Koroma, Britta Mccreedy, MD  predniSONE (DELTASONE) 20 MG tablet Take 2 tablets (40 mg total) by mouth daily for 5 days. 05/06/20 05/11/20  Merrilee Jansky, MD  Prenatal Vit-Fe Phos-FA-Omega (VITAFOL GUMMIES) 3.33-0.333-34.8 MG CHEW Chew 3 tablets by mouth daily. 05/29/19   Brock Bad, MD    Family History Family History  Problem Relation Age of Onset  . Diabetes Mother   . Miscarriages / [place] Mother     Social History Social  History   Tobacco Use  . Smoking status: Never Smoker  . Smokeless tobacco: Never Used  Vaping Use  . Vaping Use: Never used  Substance Use Topics  . Alcohol use: Not Currently    Comment: Occasional  . Drug use: Never     Allergies   Patient has no known allergies.   Review of Systems Review of Systems  Constitutional: Negative.   HENT: Negative.   Respiratory: Negative.   Skin: Positive for rash. Negative for color change and wound.     Physical Exam Triage Vital Signs ED Triage Vitals [05/06/20 0852]  Enc Vitals Group     BP 104/68     Pulse Rate 90     Resp 16     Temp 98.7 F (37.1 C)     Temp src      SpO2 99 %     Weight      Height      Head Circumference      Peak Flow      Pain Score      Pain  Loc      Pain Edu?      Excl. in GC?    No data found.  Updated Vital Signs BP 104/68   Pulse 90   Temp 98.7 F (37.1 C)   Resp 16   LMP  (LMP Unknown)   SpO2 99%   Visual Acuity Right Eye Distance:   Left Eye Distance:   Bilateral Distance:    Right Eye Near:   Left Eye Near:    Bilateral Near:     Physical Exam Vitals and nursing note reviewed.  Constitutional:      Appearance: Normal appearance.  Cardiovascular:     Rate and Rhythm: Normal rate and regular rhythm.     Pulses: Normal pulses.  Abdominal:     General: Bowel sounds are normal.     Palpations: Abdomen is soft.  Skin:    General: Skin is warm.     Findings: Rash present. No lesion.     Comments: Blistering rash over the left forearm.  No surrounding erythema.  Neurological:     Mental Status: She is alert.      UC Treatments / Results  Labs (all labs ordered are listed, but only abnormal results are displayed) Labs Reviewed - No data to display  EKG   Radiology No results found.  Procedures Procedures (including critical care time)  Medications Ordered in UC Medications - No data to display  Initial Impression / Assessment and Plan / UC Course  I  have reviewed the triage vital signs and the nursing notes.  Pertinent labs & imaging results that were available during my care of the patient were reviewed by me and considered in my medical decision making (see chart for details).    1.  Rhus dermatitis: Hydroxyzine as needed for pruritus Prednisone 40 mg orally daily for 5 days Patient is advised to return to urgent care if she develops any erythema which is spreading away from the rash If her condition worsens she is advised to return to urgent care to be reevaluated. Patient is advised not to scratch.  Application of moisturizers advised. Final Clinical Impressions(s) / UC Diagnoses   Final diagnoses:  Rhus dermatitis   Discharge Instructions   None    ED Prescriptions    Medication Sig Dispense Auth. Provider   hydrOXYzine (ATARAX/VISTARIL) 25 MG tablet Take 1 tablet (25 mg total) by mouth every 8 (eight) hours as needed for itching. 20 tablet Ashni Lonzo, Britta Mccreedy, MD   predniSONE (DELTASONE) 20 MG tablet Take 2 tablets (40 mg total) by mouth daily for 5 days. 10 tablet Angalina Ante, Britta Mccreedy, MD     PDMP not reviewed this encounter.   Merrilee Jansky, MD 05/06/20 1032

## 2020-05-06 NOTE — ED Triage Notes (Signed)
Pt has blister like rash to left forearm and now spreading onto face since Monday

## 2020-06-26 ENCOUNTER — Emergency Department (HOSPITAL_COMMUNITY)
Admission: EM | Admit: 2020-06-26 | Discharge: 2020-06-26 | Disposition: A | Discharge status: Home / Self Care | Payer: Medicaid Other | Attending: Emergency Medicine | Admitting: Emergency Medicine

## 2020-06-26 ENCOUNTER — Encounter (HOSPITAL_COMMUNITY): Payer: Self-pay

## 2020-06-26 ENCOUNTER — Emergency Department (HOSPITAL_COMMUNITY): Payer: Medicaid Other

## 2020-06-26 ENCOUNTER — Other Ambulatory Visit: Payer: Self-pay

## 2020-06-26 DIAGNOSIS — R300 Dysuria: Secondary | ICD-10-CM | POA: Diagnosis present

## 2020-06-26 DIAGNOSIS — R1030 Lower abdominal pain, unspecified: Secondary | ICD-10-CM | POA: Insufficient documentation

## 2020-06-26 DIAGNOSIS — N12 Tubulo-interstitial nephritis, not specified as acute or chronic: Secondary | ICD-10-CM

## 2020-06-26 LAB — URINALYSIS, ROUTINE W REFLEX MICROSCOPIC
Bacteria, UA: NONE SEEN
Bilirubin Urine: NEGATIVE
Glucose, UA: NEGATIVE mg/dL
Hgb urine dipstick: NEGATIVE
Ketones, ur: NEGATIVE mg/dL
Nitrite: NEGATIVE
Protein, ur: 100 mg/dL — AB
Specific Gravity, Urine: 1.023 (ref 1.005–1.030)
WBC, UA: >50 WBC/hpf — ABNORMAL HIGH (ref 0–5)
pH: 9 — ABNORMAL HIGH (ref 5.0–8.0)

## 2020-06-26 LAB — COMPREHENSIVE METABOLIC PANEL
ALT: 6 U/L (ref 0–44)
AST: 22 U/L (ref 15–41)
Albumin: 3.6 g/dL (ref 3.5–5.0)
Alkaline Phosphatase: 78 U/L (ref 38–126)
Anion gap: 12 (ref 5–15)
BUN: 12 mg/dL (ref 6–20)
CO2: 27 mmol/L (ref 22–32)
Calcium: 9.1 mg/dL (ref 8.9–10.3)
Chloride: 100 mmol/L (ref 98–111)
Creatinine, Ser: 1.5 mg/dL — ABNORMAL HIGH (ref 0.44–1.00)
GFR calc Af Amer: 57 mL/min — ABNORMAL LOW (ref >60)
GFR calc non Af Amer: 49 mL/min — ABNORMAL LOW (ref >60)
Glucose, Bld: 110 mg/dL — ABNORMAL HIGH (ref 70–99)
Potassium: 3.6 mmol/L (ref 3.5–5.1)
Sodium: 139 mmol/L (ref 135–145)
Total Bilirubin: 0.4 mg/dL (ref 0.3–1.2)
Total Protein: 7.3 g/dL (ref 6.5–8.1)

## 2020-06-26 LAB — I-STAT BETA HCG BLOOD, ED (MC, WL, AP ONLY): I-stat hCG, quantitative: <5 m[IU]/mL (ref <5)

## 2020-06-26 LAB — CBC
HCT: 45.8 % (ref 36.0–46.0)
Hemoglobin: 14.7 g/dL (ref 12.0–15.0)
MCH: 28.3 pg (ref 26.0–34.0)
MCHC: 32.1 g/dL (ref 30.0–36.0)
MCV: 88.2 fL (ref 80.0–100.0)
Platelets: 328 10*3/uL (ref 150–400)
RBC: 5.19 MIL/uL — ABNORMAL HIGH (ref 3.87–5.11)
RDW: 13.1 % (ref 11.5–15.5)
WBC: 12 10*3/uL — ABNORMAL HIGH (ref 4.0–10.5)
nRBC: 0 % (ref 0.0–0.2)

## 2020-06-26 LAB — WET PREP, GENITAL
Clue Cells Wet Prep HPF POC: NONE SEEN
Sperm: NONE SEEN
Trich, Wet Prep: NONE SEEN
Yeast Wet Prep HPF POC: NONE SEEN

## 2020-06-26 LAB — LIPASE, BLOOD: Lipase: 33 U/L (ref 11–51)

## 2020-06-26 LAB — LACTIC ACID, PLASMA: Lactic Acid, Venous: 1.1 mmol/L (ref 0.5–1.9)

## 2020-06-26 MED ORDER — CEPHALEXIN 500 MG PO CAPS: 500.0000 mg | ORAL_CAPSULE | Freq: Four times a day (QID) | ORAL | 0 refills | Status: AC

## 2020-06-26 MED ORDER — SODIUM CHLORIDE 0.9 % IV BOLUS
1000.0000 mL | Freq: Once | INTRAVENOUS | Status: AC
  Administered 2020-06-26: 1000 mL via INTRAVENOUS

## 2020-06-26 MED ORDER — SODIUM CHLORIDE 0.9 % IV SOLN
1.0000 g | Freq: Once | INTRAVENOUS | Status: AC
  Administered 2020-06-26: 1 g via INTRAVENOUS
  Filled 2020-06-26: qty 10

## 2020-06-26 MED ORDER — IBUPROFEN 400 MG PO TABS
600.0000 mg | ORAL_TABLET | Freq: Once | ORAL | Status: AC
  Administered 2020-06-26: 600 mg via ORAL
  Filled 2020-06-26: qty 1

## 2020-06-26 NOTE — ED Triage Notes (Signed)
Pt reports dysuria and frequency since yesterday around 1600. Today she began having RLQ and Right side back pain. Denies n/v/d/vaginal discharge

## 2020-06-26 NOTE — Discharge Instructions (Signed)
You are seen today for pyelonephritis.  Please use the attached instructions to learn more about this.  Essentially it is an infection of your kidneys due to your urinary tract infection.  Please take the antibiotics prescribed in full.  You can use ibuprofen as prescribed on the bottle for pain.  Your creatinine was slightly elevated, I want you to get this rechecked in the next couple of days.  Please follow-up with your primary care physician in the next couple of days, if you do not have one you can use a list below.  If you start to have any new or worsening concerning symptoms such as fevers, vomiting, worsening pain please come back to the emergency department.  Your STD test is pending, you can obtain this on my chart.  The directions to do this are in this packet.

## 2020-06-26 NOTE — ED Provider Notes (Signed)
[facility] EMERGENCY DEPARTMENT Provider Note   CSN: [id number] Arrival date & time: 06/26/20  1646     History Chief Complaint  Patient presents with  . Abdominal Pain  . Dysuria    Jacqueline Mitchell is a 24 y.o. female with no pertinent past medical history that presents to the emergency department today for dysuria, frequency, right lower quadrant pain, right-sided back pain for the past 2 days.  Patient states that worst pain is in her flank on the right side that started this morning.  Also admits to dysuria and frequency that started yesterday, has never had UTI before.  Occurred after intercourse.  Has never had kidney stone before.  Denies any hematuria..  Denies any vaginal discharge, vaginal pain, vaginal bleeding.  Patient states that she would like to get STD checked.  Has never had anything happen like this to her before.  No abdominal surgeries.  No nausea, vomiting, fevers, chills.  Has not taken anything for this.  States that she was in normal health before this.  Has been eating and drinking normally.   HPI     Past Medical History:  Diagnosis Date  . Medical history non-contributory     Patient Active Problem List   Diagnosis Date Noted  . Gastroesophageal reflux during pregnancy, antepartum 04/17/2019  . Late prenatal care affecting pregnancy in second trimester 12/26/2018  . Supervision of normal first pregnancy 12/10/2018    Past Surgical History:  Procedure Laterality Date  . NO PAST SURGERIES       OB History    Gravida  1   Para  1   Term  1   Preterm      AB      Living  1     SAB      TAB      Ectopic      Multiple  0   Live Births  1           Family History  Problem Relation Age of Onset  . Diabetes Mother   . Miscarriages / [place] Mother     Social History   Tobacco Use  . Smoking status: Never Smoker  . Smokeless tobacco: Never Used  Vaping Use  . Vaping Use: Never used  Substance Use Topics    . Alcohol use: Not Currently    Comment: Occasional  . Drug use: Never    Home Medications Prior to Admission medications   Medication Sig Start Date End Date Taking? Authorizing Provider  cephALEXin (KEFLEX) 500 MG capsule Take 1 capsule (500 mg total) by mouth 4 (four) times daily for 7 days. 06/26/20 07/03/20  Farrel Gordon, PA-C  hydrOXYzine (ATARAX/VISTARIL) 25 MG tablet Take 1 tablet (25 mg total) by mouth every 8 (eight) hours as needed for itching. 05/06/20   Lamptey, Britta Mccreedy, MD  Prenatal Vit-Fe Phos-FA-Omega (VITAFOL GUMMIES) 3.33-0.333-34.8 MG CHEW Chew 3 tablets by mouth daily. 05/29/19   Brock Bad, MD    Allergies    Patient has no known allergies.  Review of Systems   Review of Systems  Constitutional: Negative for chills, diaphoresis, fatigue and fever.  HENT: Negative for congestion, sore throat and trouble swallowing.   Eyes: Negative for pain and visual disturbance.  Respiratory: Negative for cough, shortness of breath and wheezing.   Cardiovascular: Negative for chest pain, palpitations and leg swelling.  Gastrointestinal: Positive for abdominal pain. Negative for abdominal distention, diarrhea, nausea and vomiting.  Genitourinary: Positive  for dysuria, flank pain and frequency. Negative for decreased urine volume, difficulty urinating, dyspareunia, hematuria, menstrual problem, pelvic pain, urgency, vaginal bleeding, vaginal discharge and vaginal pain.  Musculoskeletal: Negative for back pain, neck pain and neck stiffness.  Skin: Negative for pallor.  Neurological: Negative for dizziness, speech difficulty, weakness and headaches.  Psychiatric/Behavioral: Negative for confusion.    Physical Exam Updated Vital Signs BP 92/63   Pulse 71   Temp 98.5 F (36.9 C) (Oral)   Resp 18   Ht  (1.575 m)   Wt 40.8 kg   SpO2 99%   Breastfeeding Yes   BMI 16.46 kg/m   Physical Exam Constitutional:      General: She is not in acute distress.     Appearance: Normal appearance. She is not ill-appearing, toxic-appearing or diaphoretic.  HENT:     Mouth/Throat:     Mouth: Mucous membranes are moist.     Pharynx: Oropharynx is clear.  Eyes:     General: No scleral icterus.    Extraocular Movements: Extraocular movements intact.     Pupils: Pupils are equal, round, and reactive to light.  Cardiovascular:     Rate and Rhythm: Normal rate and regular rhythm.     Pulses: Normal pulses.     Heart sounds: Normal heart sounds.  Pulmonary:     Effort: Pulmonary effort is normal. No respiratory distress.     Breath sounds: Normal breath sounds. No stridor. No wheezing, rhonchi or rales.  Chest:     Chest wall: No tenderness.  Abdominal:     General: Abdomen is flat. Bowel sounds are normal. There is no distension.     Palpations: Abdomen is soft.     Tenderness: There is abdominal tenderness in the suprapubic area. There is right CVA tenderness. There is no left CVA tenderness, guarding or rebound. Negative signs include Murphy's sign, Rovsing's sign, McBurney's sign, psoas sign and obturator sign.  Genitourinary:    Comments: Chaperone present. Cervix normal, mild amount of white discharge coming from cervix. Cervix closed. No friability, no erythema. No adnexal tenderness, no cervical motion tenderness. Patient tolerated exam well. Musculoskeletal:        General: No swelling or tenderness. Normal range of motion.     Cervical back: Normal range of motion and neck supple. No rigidity.     Right lower leg: No edema.     Left lower leg: No edema.  Skin:    General: Skin is warm and dry.     Capillary Refill: Capillary refill takes less than 2 seconds.     Coloration: Skin is not pale.  Neurological:     General: No focal deficit present.     Mental Status: She is alert and oriented to person, place, and time.  Psychiatric:        Mood and Affect: Mood normal.        Behavior: Behavior normal.     ED Results / Procedures /  Treatments   Labs (all labs ordered are listed, but only abnormal results are displayed) Labs Reviewed  WET PREP, GENITAL - Abnormal; Notable for the following components:      Result Value   WBC, Wet Prep HPF POC FEW (*)    All other components within normal limits  COMPREHENSIVE METABOLIC PANEL - Abnormal; Notable for the following components:   Glucose, Bld 110 (*)    Creatinine, Ser 1.50 (*)    GFR calc non Af Amer 49 (*)  GFR calc Af Amer 57 (*)    All other components within normal limits  CBC - Abnormal; Notable for the following components:   WBC 12.0 (*)    RBC 5.19 (*)    All other components within normal limits  URINALYSIS, ROUTINE W REFLEX MICROSCOPIC - Abnormal; Notable for the following components:   APPearance TURBID (*)    pH 9.0 (*)    Protein, ur 100 (*)    Leukocytes,Ua LARGE (*)    WBC, UA >50 (*)    All other components within normal limits  URINE CULTURE  LIPASE, BLOOD  LACTIC ACID, PLASMA  LACTIC ACID, PLASMA  I-STAT BETA HCG BLOOD, ED ([facility], [facility], [facility] ONLY)  GC/CHLAMYDIA PROBE AMP ([place]) NOT AT [facility]    EKG None  Radiology CT Renal Stone Study  Result Date: 06/26/2020 CLINICAL DATA:  Dysuria and frequency. EXAM: CT ABDOMEN AND PELVIS WITHOUT CONTRAST TECHNIQUE: Multidetector CT imaging of the abdomen and pelvis was performed following the standard protocol without IV contrast. COMPARISON:  None. FINDINGS: Lower chest: No acute abnormality. Hepatobiliary: No focal liver abnormality is seen. No gallstones, gallbladder wall thickening, or biliary dilatation. Pancreas: Unremarkable. No pancreatic ductal dilatation or surrounding inflammatory changes. Spleen: Normal in size without focal abnormality. Adrenals/Urinary Tract: Adrenal glands are unremarkable. Kidneys are normal in size, without focal lesions. Mild right-sided hydronephrosis and hydroureter are seen, without evidence of renal calculi. Bladder is unremarkable. Stomach/Bowel: Stomach is within  normal limits. Appendix appears normal. No evidence of bowel wall thickening, distention, or inflammatory changes. Vascular/Lymphatic: No significant vascular findings are present. No enlarged abdominal or pelvic lymph nodes. Reproductive: Uterus and bilateral adnexa are unremarkable. Other: No abdominal wall hernia or abnormality. No abdominopelvic ascites. Musculoskeletal: No acute or significant osseous findings. IMPRESSION: Mild right-sided hydronephrosis and hydroureter, without evidence of renal calculi. Electronically Signed   By: Aram Candela M.D.   On: 06/26/2020 19:28    Procedures Procedures (including critical care time)  Medications Ordered in ED Medications  sodium chloride 0.9 % bolus 1,000 mL (0 mLs Intravenous Stopped 06/26/20 2021)  ibuprofen (ADVIL) tablet 600 mg (600 mg Oral Given 06/26/20 1918)  cefTRIAXone (ROCEPHIN) 1 g in sodium chloride 0.9 % 100 mL IVPB (0 g Intravenous Stopped 06/26/20 2054)  sodium chloride 0.9 % bolus 1,000 mL (0 mLs Intravenous Stopped 06/26/20 2213)    ED Course  I have reviewed the triage vital signs and the nursing notes.  Pertinent labs & imaging results that were available during my care of the patient were reviewed by me and considered in my medical decision making (see chart for details).    MDM Rules/Calculators/A&P                         Petra Talkington is a 24 y.o. female with no pertinent past medical history that presents to the emergency department today for dysuria, frequency, right lower quadrant pain, right-sided back pain for the past 2 days.   suspicious for pyelonephritis, nephrolithiasis, PID, TOA, appendicitis.  Will obtain labs and CT renal study at this time.Physical exam with right CVA tenderness and suprapubic pain.  Pelvic exam benign.  CT shows right-sided hydronephrosis and hydroureter, no nephrolithiasis.  CMP with elevated creatinine, most likely from pyelonephritis.  Urinalysis also suggestive of UTI.  Small leukocytosis  of 12.  2 L of IV fluid given, will also give 1 g of ceftriaxone this time.  Pain controlled with ibuprofen, patient does states  that she does not need any more pain control.  Pressures have been in the systolics of 90s, this does seem to be consistent with patient's normal blood pressures.  Shared decision-making about coming in for pyelonephritis, patient states that she does not want this at this time because she has a baby at home.  Did discuss risks of this.  Lactic acid normal, patient appears well and healthy and pain has been controlled.  Patient will follow up with primary care, I'm okay with patient going home at this time.  Spoke to pharmacy, Romeo Apple about antibiotic choices patient is still breast-feeding. He recommended Keflex 500 mg 4 times daily for 7 days.  Doubt need for further emergent work up at this time. I explained the diagnosis and have given explicit precautions to return to the ER including for any other new or worsening symptoms. The patient understands and accepts the medical plan as it's been dictated and I have answered their questions. Discharge instructions concerning home care and prescriptions have been given. The patient is STABLE and is discharged to home in good condition.  I discussed this case with my attending physician who cosigned this note including patient's presenting symptoms, physical exam, and planned diagnostics and interventions. Attending physician stated agreement with plan or made changes to plan which were implemented.  Final Clinical Impression(s) / ED Diagnoses Final diagnoses:  Pyelonephritis    Rx / DC Orders ED Discharge Orders         Ordered    cephALEXin (KEFLEX) 500 MG capsule  4 times daily        06/26/20 2211           Farrel Gordon, PA-C 06/26/20 2328    Pricilla Loveless, MD 06/27/20 1531

## 2020-06-28 LAB — GC/CHLAMYDIA PROBE AMP (~~LOC~~) NOT AT ARMC
Chlamydia: NEGATIVE
Comment: NEGATIVE
Comment: NORMAL
Neisseria Gonorrhea: NEGATIVE

## 2020-06-29 LAB — URINE CULTURE: Culture: >=100000 — AB

## 2021-01-10 ENCOUNTER — Ambulatory Visit (INDEPENDENT_AMBULATORY_CARE_PROVIDER_SITE_OTHER): Payer: Medicaid Other | Admitting: Obstetrics and Gynecology

## 2021-01-10 ENCOUNTER — Other Ambulatory Visit: Payer: Self-pay

## 2021-01-10 ENCOUNTER — Encounter: Payer: Self-pay | Admitting: Obstetrics and Gynecology

## 2021-01-10 VITALS — BP 110/73 | HR 88 | Ht 62.0 in | Wt 103.0 lb

## 2021-01-10 DIAGNOSIS — N970 Female infertility associated with anovulation: Secondary | ICD-10-CM | POA: Diagnosis not present

## 2021-01-10 MED ORDER — MEDROXYPROGESTERONE ACETATE 10 MG PO TABS: 10.0000 mg | ORAL_TABLET | Freq: Every day | ORAL | 2 refills | Status: DC

## 2021-01-10 NOTE — Progress Notes (Signed)
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 Patient gave birth in 2020 and breast fed for 6 months while taking depo-provera. Patient reports last dose of depo-provera was 11/2019. She reports amenorrhea since delivery. She admits to a history of irregular menses prior to her pregnancy describing a menses occuring every 3 months or so. Patient denies any pelvic pain or abnormal discharge. She is taking prenatal vitamins. She denies galactorrhea or frequent headaches. First pregnancy conceived spontaneously  Past Medical History:  Diagnosis Date  . Medical history non-contributory    Past Surgical History:  Procedure Laterality Date  . NO PAST SURGERIES     Family History  Problem Relation Age of Onset  . Diabetes Mother   . Miscarriages / [place] Mother    Social History   Tobacco Use  . Smoking status: Never Smoker  . Smokeless tobacco: Never Used  Vaping Use  . Vaping Use: Never used  Substance Use Topics  . Alcohol use: Not Currently    Comment: Occasional  . Drug use: Never   ROS See pertinent in HPI. All other systems reviewed and non contributory Blood pressure 110/73, pulse 88, height 5\' 2"  (1.575 m), weight 103 lb (46.7 kg), not currently breastfeeding. GENERAL: Well-developed, well-nourished female in no acute distress.  BREASTS: Symmetric in size. No palpable masses or lymphadenopathy, skin changes, or nipple drainage. ABDOMEN: Soft, nontender, nondistended. No organomegaly. PELVIC: Not indicated EXTREMITIES: No cyanosis, clubbing, or edema, 2+ distal pulses.  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

## 2021-01-10 NOTE — Progress Notes (Addendum)
GYN presents for Infertility, she stopped DEPO 1/28/ 2021, she have had a period since 2021.  Denies pelvic pain or discharge. She has been trying to conceive since 2021.  Last PAP 08/13/2019

## 2021-01-11 LAB — TSH: TSH: 1.72 u[IU]/mL (ref 0.450–4.500)

## 2021-01-11 LAB — FOLLICLE STIMULATING HORMONE: FSH: 5.2 m[IU]/mL

## 2021-01-11 LAB — PROLACTIN: Prolactin: 16.6 ng/mL (ref 4.8–23.3)

## 2021-01-11 LAB — LUTEINIZING HORMONE: LH: 4.6 m[IU]/mL

## 2021-01-13 LAB — URINE CULTURE

## 2021-01-20 ENCOUNTER — Ambulatory Visit
Admission: RE | Admit: 2021-01-20 | Discharge: 2021-01-20 | Disposition: A | Discharge status: Home / Self Care | Payer: Medicaid Other | Source: Ambulatory Visit | Attending: Obstetrics and Gynecology | Admitting: Obstetrics and Gynecology

## 2021-01-20 ENCOUNTER — Other Ambulatory Visit: Payer: Self-pay

## 2021-01-20 DIAGNOSIS — N970 Female infertility associated with anovulation: Secondary | ICD-10-CM | POA: Insufficient documentation

## 2021-01-21 ENCOUNTER — Encounter: Payer: Self-pay | Admitting: Obstetrics & Gynecology

## 2021-01-21 DIAGNOSIS — N970 Female infertility associated with anovulation: Secondary | ICD-10-CM | POA: Insufficient documentation

## 2021-01-27 IMAGING — CT CT RENAL STONE PROTOCOL
2 of 4 series · 16 of 46 positions shown, 18 images · non-contrast
Comparison: None.

CLINICAL DATA: Dysuria and frequency.

EXAM:
CT ABDOMEN AND PELVIS WITHOUT CONTRAST
TECHNIQUE: Multidetector CT imaging of the abdomen and pelvis was performed
following the standard protocol without IV contrast.

[Series 3: ap without · axial · non-contrast · 0.65mm/px · z∈[+861,+1246]mm · 13 of 87 slices shown, 15 images]
[im 5/87  soft-tissue]
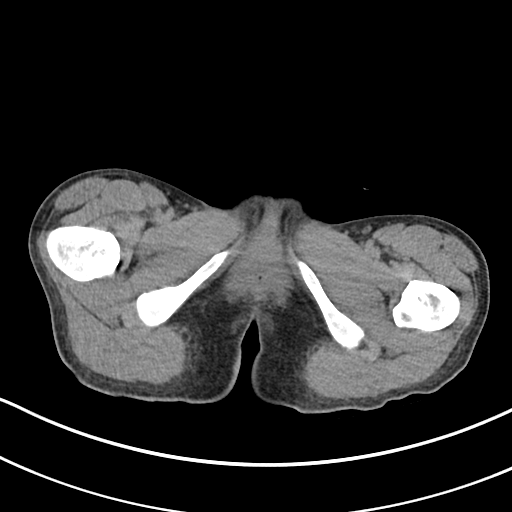
[im 5/87  bone]
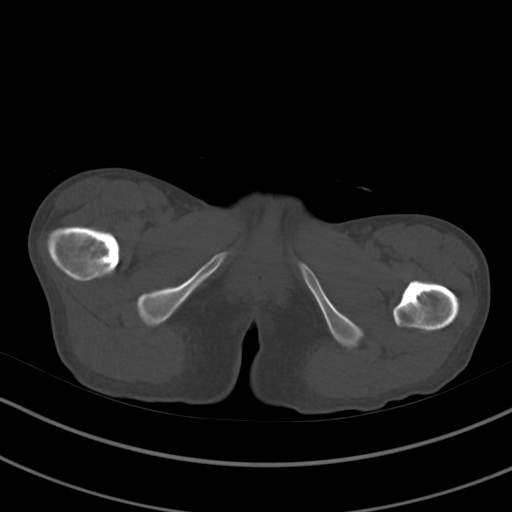
[im 10/87  soft-tissue]
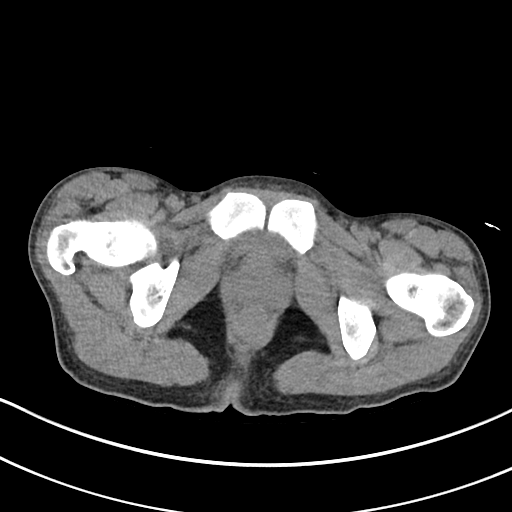
[im 20/87  soft-tissue]
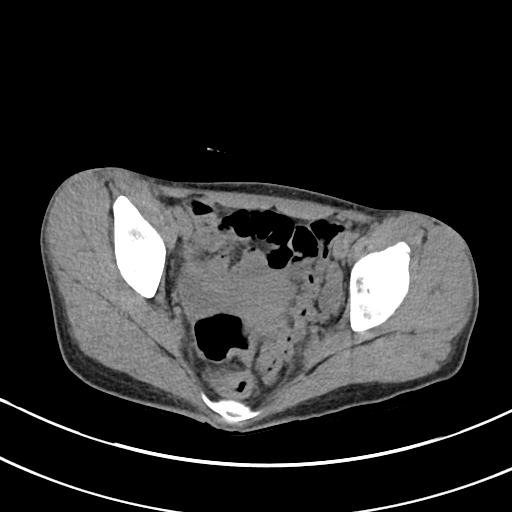
[im 24/87  soft-tissue]
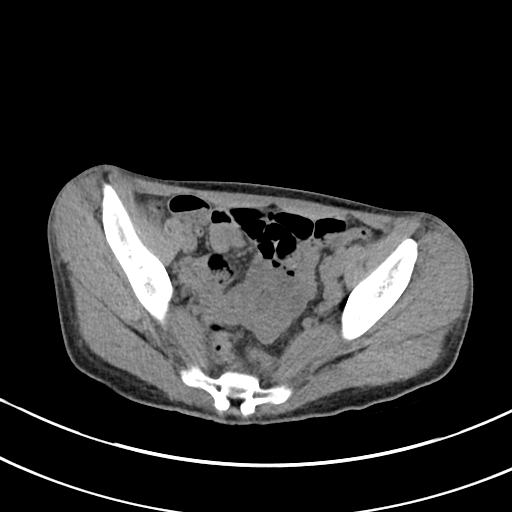
[im 29/87  soft-tissue]
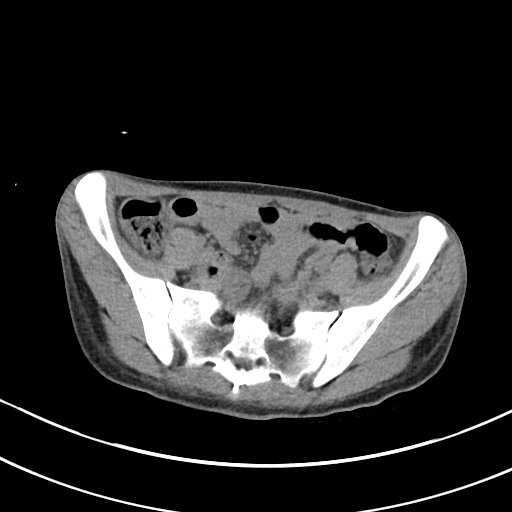
[im 39/87  soft-tissue]
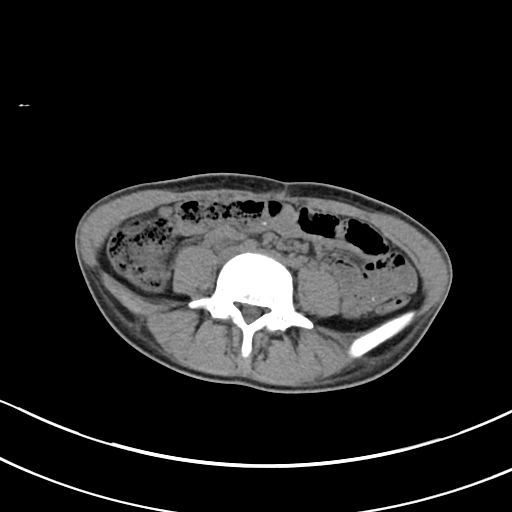
[im 44/87  soft-tissue]
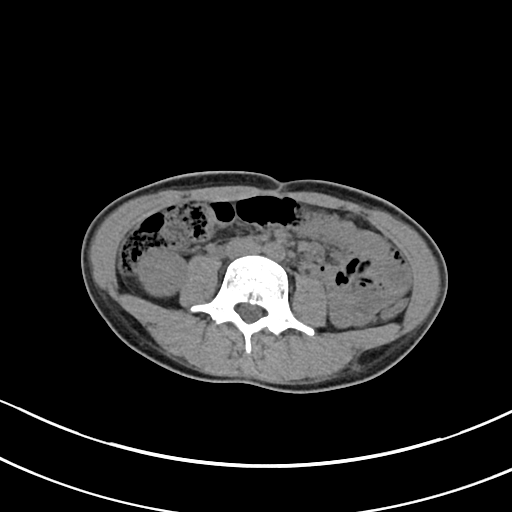
[im 48/87  soft-tissue]
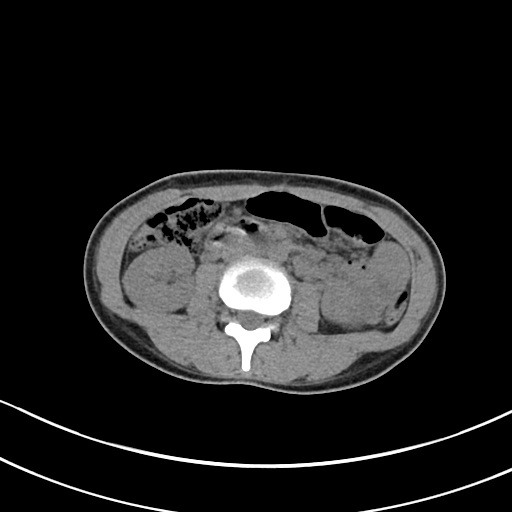
[im 58/87  soft-tissue]
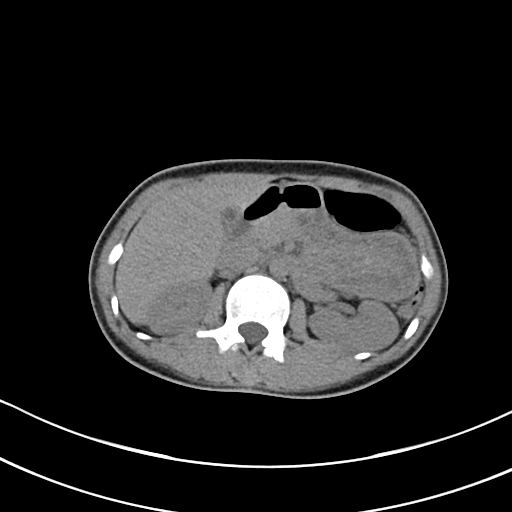
[im 58/87  bone]
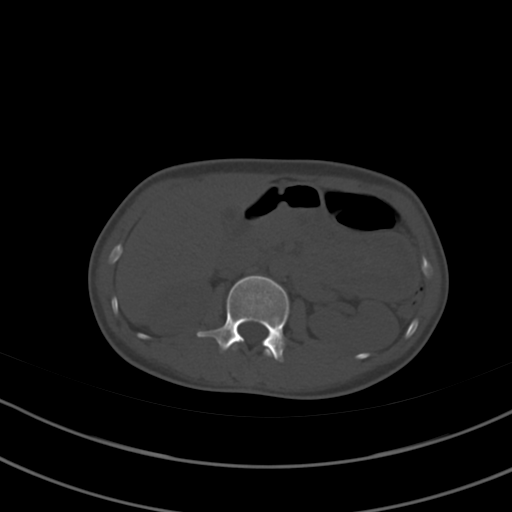
[im 63/87  soft-tissue]
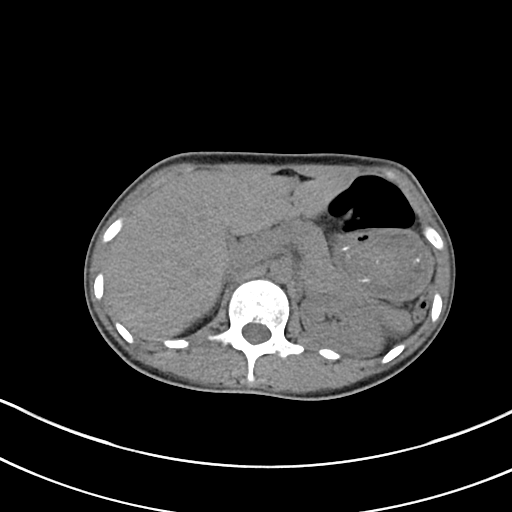
[im 67/87  soft-tissue]
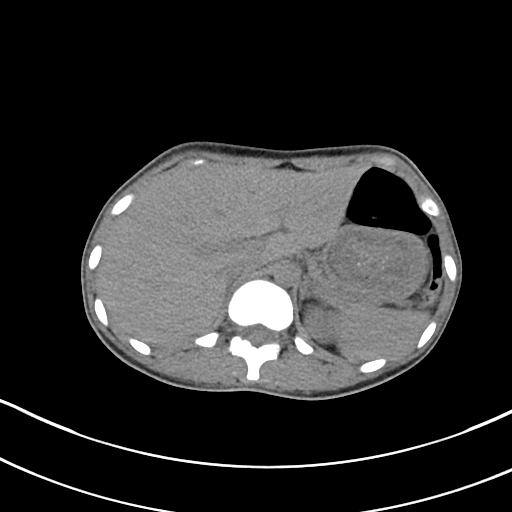
[im 77/87  soft-tissue]
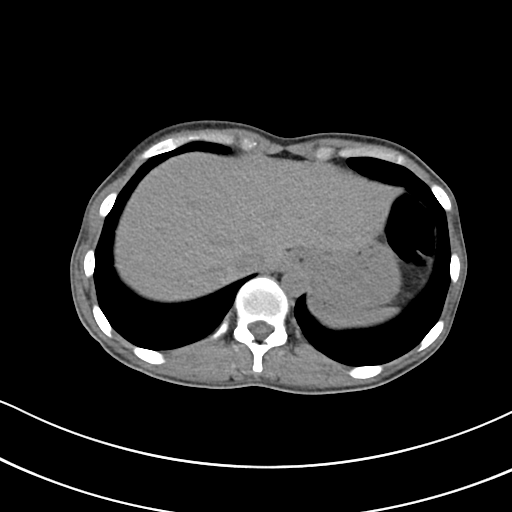
[im 82/87  soft-tissue]
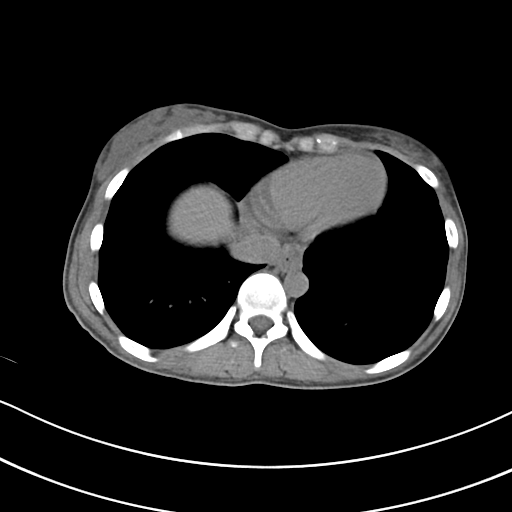

[Series 6: cor · coronal · 0.60mm/px · 3 of 78 slices shown]
[im 26/78  soft-tissue]
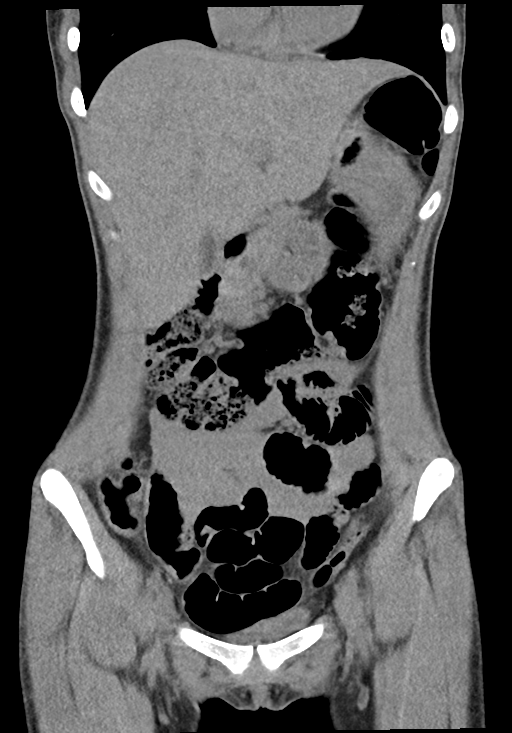
[im 35/78  soft-tissue]
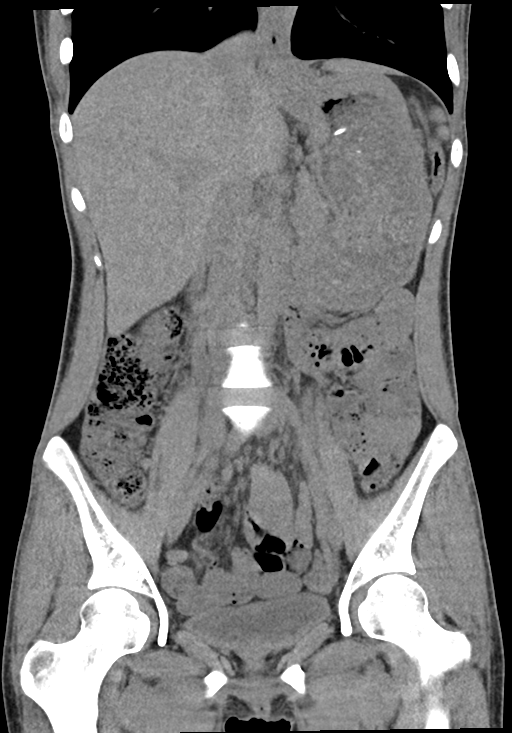
[im 43/78  soft-tissue]
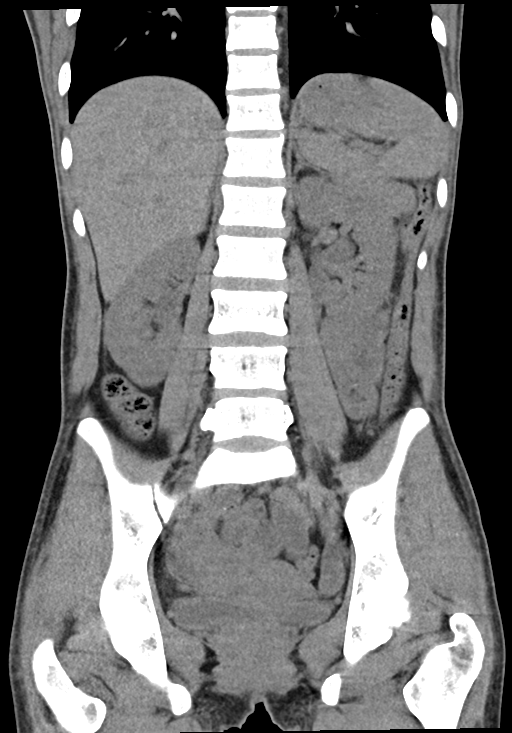

[16 of 46 positions shown; findings below may reference images not displayed]

FINDINGS: Lower chest: No acute abnormality.

Hepatobiliary: No focal liver abnormality is seen. No gallstones,
gallbladder wall thickening, or biliary dilatation.

Pancreas: Unremarkable. No pancreatic ductal dilatation or
surrounding inflammatory changes.

Spleen: Normal in size without focal abnormality.

Adrenals/Urinary Tract: Adrenal glands are unremarkable. Kidneys are
normal in size, without focal lesions. Mild right-sided
hydronephrosis and hydroureter are seen, without evidence of renal
calculi. Bladder is unremarkable.

Stomach/Bowel: Stomach is within normal limits. Appendix appears
normal. No evidence of bowel wall thickening, distention, or
inflammatory changes.

Vascular/Lymphatic: No significant vascular findings are present. No
enlarged abdominal or pelvic lymph nodes.

Reproductive: Uterus and bilateral adnexa are unremarkable.

Other: No abdominal wall hernia or abnormality. No abdominopelvic
ascites.

Musculoskeletal: No acute or significant osseous findings.
IMPRESSION: Mild right-sided hydronephrosis and hydroureter, without evidence of
renal calculi.

## 2021-02-01 ENCOUNTER — Encounter: Payer: Self-pay | Admitting: Obstetrics and Gynecology

## 2021-02-01 ENCOUNTER — Telehealth (INDEPENDENT_AMBULATORY_CARE_PROVIDER_SITE_OTHER): Payer: Medicaid Other | Admitting: Obstetrics and Gynecology

## 2021-02-01 ENCOUNTER — Other Ambulatory Visit: Payer: Self-pay

## 2021-02-01 VITALS — Ht 62.0 in | Wt 100.0 lb

## 2021-02-01 DIAGNOSIS — Z712 Person consulting for explanation of examination or test findings: Secondary | ICD-10-CM | POA: Diagnosis not present

## 2021-02-01 DIAGNOSIS — N914 Secondary oligomenorrhea: Secondary | ICD-10-CM

## 2021-02-01 MED ORDER — MEDROXYPROGESTERONE ACETATE 10 MG PO TABS: 10.0000 mg | ORAL_TABLET | Freq: Every day | ORAL | 2 refills | Status: DC

## 2021-02-01 MED ORDER — LETROZOLE 2.5 MG PO TABS: 2.5000 mg | ORAL_TABLET | Freq: Every day | ORAL | 2 refills | Status: DC

## 2021-02-01 NOTE — Progress Notes (Signed)
Pt presents for follow up virtual visit to discuss ultrasound results and fertility.

## 2021-02-01 NOTE — Progress Notes (Signed)
GYNECOLOGY VIRTUAL VISIT ENCOUNTER NOTE  [facility]: [facility]   Patient location: Home  I connected with Jacqueline Mitchell on 02/01/21 at  3:15 PM EDT by MyChart Video Encounter and verified that I am speaking with the correct person using two identifiers.   I discussed the limitations, risks, security and privacy concerns of performing an evaluation and management service virtually and the availability of in person appointments. I also discussed with the patient that there may be a patient responsible charge related to this service. The patient expressed understanding and agreed to proceed.   History:  Jacqueline Mitchell is a 25 y.o. 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 Patient here to discuss results of ultrasound and blood work. Patient had a normal post progesterone induced menses and normal labs and ultrasound. She desires desperately for a pregnancy. She denies any abnormal vaginal discharge, bleeding, pelvic pain or other concerns.       Past Medical History:  Diagnosis Date  . Medical history non-contributory    Past Surgical History:  Procedure Laterality Date  . NO PAST SURGERIES     The following portions of the patient's history were reviewed and updated as appropriate: allergies, current medications, past family history, past medical history, past social history, past surgical history and problem list.   Health Maintenance:  Normal pap and negative HRHPV on 08/2019.    Review of Systems:  Pertinent items noted in HPI and remainder of comprehensive ROS otherwise negative.  Physical Exam:   General:  Alert, oriented and cooperative. Patient appears to be in no acute distress.  Mental Status: Normal mood and affect. Normal behavior. Normal judgment and thought content.   Respiratory: Normal respiratory effort, no problems with respiration noted  Rest of physical exam deferred due to type of encounter  Labs and Imaging No  results found for this or any previous visit (from the past 336 hour(s)). 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 Transabdominal technique was performed for global imaging of the pelvis including uterus, ovaries, adnexal regions, and pelvic cul-de-sac. It was necessary to proceed with endovaginal exam following the transabdominal exam to visualize the endometrium and LEFT ovary. COMPARISON:  None FINDINGS: Uterus Measurements: 6.6 x 3.6 x 5.6 cm = volume: 69 mL. Retroverted and retroflexed. Normal morphology without mass Endometrium Thickness: 3 mm.  No endometrial fluid or focal abnormality Right ovary Measurements: 4.6 x 2.3 x 2.6 cm = volume: 13.9 mL. Normal morphology without mass. Left ovary Measurements: 3.3 x 2.0 x 2.5 cm = volume: 8.9 mL. Normal morphology without mass Other findings Trace free pelvic fluid.  No adnexal masses. IMPRESSION: Normal exam. Electronically Signed   By: Ulyses Southward M.D.   On: 01/20/2021 17:00       Assessment and Plan:     1. Secondary oligomenorrhea Rx progesterone and femera provided Patient instructed to use ovulation predictor kits for assistance Patient to take prenatal vitamins now Patient to contact office if no pregnancy after 3 months in order to increase dosage to 5 mg before referring to infertility  2. Encounter to discuss test results        I discussed the assessment and treatment plan with the patient. The patient was provided an opportunity  to ask questions and all were answered. The patient agreed with the plan and demonstrated an understanding of the instructions.   The patient was advised to call back or seek an  in-person evaluation/go to the ED if the symptoms worsen or if the condition fails to improve as anticipated.  I provided 15 minutes of face-to-face time during this encounter.   Jacqueline Antigua, MD [facility] [employer], [facility]

## 2021-05-25 ENCOUNTER — Other Ambulatory Visit (HOSPITAL_COMMUNITY)
Admission: RE | Admit: 2021-05-25 | Discharge: 2021-05-25 | Disposition: A | Discharge status: Home / Self Care | Payer: Medicaid Other | Source: Ambulatory Visit | Attending: Women's Health | Admitting: Women's Health

## 2021-05-25 ENCOUNTER — Ambulatory Visit (INDEPENDENT_AMBULATORY_CARE_PROVIDER_SITE_OTHER): Payer: Medicaid Other | Admitting: Women's Health

## 2021-05-25 ENCOUNTER — Ambulatory Visit (INDEPENDENT_AMBULATORY_CARE_PROVIDER_SITE_OTHER): Payer: Medicaid Other

## 2021-05-25 ENCOUNTER — Encounter: Payer: Self-pay | Admitting: Women's Health

## 2021-05-25 ENCOUNTER — Other Ambulatory Visit: Payer: Self-pay

## 2021-05-25 VITALS — BP 108/66 | HR 94 | Ht 62.0 in | Wt 116.0 lb

## 2021-05-25 DIAGNOSIS — Z3A11 11 weeks gestation of pregnancy: Secondary | ICD-10-CM | POA: Diagnosis not present

## 2021-05-25 DIAGNOSIS — Z01419 Encounter for gynecological examination (general) (routine) without abnormal findings: Secondary | ICD-10-CM

## 2021-05-25 DIAGNOSIS — Z3481 Encounter for supervision of other normal pregnancy, first trimester: Secondary | ICD-10-CM

## 2021-05-25 DIAGNOSIS — Z3491 Encounter for supervision of normal pregnancy, unspecified, first trimester: Secondary | ICD-10-CM | POA: Insufficient documentation

## 2021-05-25 DIAGNOSIS — N9489 Other specified conditions associated with female genital organs and menstrual cycle: Secondary | ICD-10-CM

## 2021-05-25 DIAGNOSIS — O3680X0 Pregnancy with inconclusive fetal viability, not applicable or unspecified: Secondary | ICD-10-CM

## 2021-05-25 DIAGNOSIS — Z3687 Encounter for antenatal screening for uncertain dates: Secondary | ICD-10-CM

## 2021-05-25 DIAGNOSIS — N949 Unspecified condition associated with female genital organs and menstrual cycle: Secondary | ICD-10-CM

## 2021-05-25 DIAGNOSIS — Z3201 Encounter for pregnancy test, result positive: Secondary | ICD-10-CM

## 2021-05-25 DIAGNOSIS — Z3480 Encounter for supervision of other normal pregnancy, unspecified trimester: Secondary | ICD-10-CM | POA: Insufficient documentation

## 2021-05-25 MED ORDER — BLOOD PRESSURE KIT DEVI: 1.0000 | 0 refills | Status: DC

## 2021-05-25 NOTE — Patient Instructions (Signed)
Preventive Care 21-23 Years Old, Female Preventive care refers to lifestyle choices and visits with your health care provider that can promote health and wellness. This includes: A yearly physical exam. This is also called an annual wellness visit. Regular dental and eye exams. Immunizations. Screening for certain conditions. Healthy lifestyle choices, such as: Eating a healthy diet. Getting regular exercise. Not using drugs or products that contain nicotine and tobacco. Limiting alcohol use. What can I expect for my preventive care visit? Physical exam Your health care provider may check your: Height and weight. These may be used to calculate your BMI (body mass index). BMI is a measurement that tells if you are at a healthy weight. Heart rate and blood pressure. Body temperature. Skin for abnormal spots. Counseling Your health care provider may ask you questions about your: Past medical problems. Family's medical history. Alcohol, tobacco, and drug use. Emotional well-being. Home life and relationship well-being. Sexual activity. Diet, exercise, and sleep habits. Work and work environment. Access to firearms. Method of birth control. Menstrual cycle. Pregnancy history. What immunizations do I need?  Vaccines are usually given at various ages, according to a schedule. Your health care provider will recommend vaccines for you based on your age, medicalhistory, and lifestyle or other factors, such as travel or where you work. What tests do I need?  Blood tests Lipid and cholesterol levels. These may be checked every 5 years starting at age 20. Hepatitis C test. Hepatitis B test. Screening Diabetes screening. This is done by checking your blood sugar (glucose) after you have not eaten for a while (fasting). STD (sexually transmitted disease) testing, if you are at risk. BRCA-related cancer screening. This may be done if you have a family history of breast, ovarian, tubal, or  peritoneal cancers. Pelvic exam and Pap test. This may be done every 3 years starting at age 21. Starting at age 30, this may be done every 5 years if you have a Pap test in combination with an HPV test. Talk with your health care provider about your test results, treatment options,and if necessary, the need for more tests. Follow these instructions at home: Eating and drinking  Eat a healthy diet that includes fresh fruits and vegetables, whole grains, lean protein, and low-fat dairy products. Take vitamin and mineral supplements as recommended by your health care provider. Do not drink alcohol if: Your health care provider tells you not to drink. You are pregnant, may be pregnant, or are planning to become pregnant. If you drink alcohol: Limit how much you have to 0-1 drink a day. Be aware of how much alcohol is in your drink. In the [place] one drink equals one 12 oz bottle of beer (355 mL), one 5 oz glass of wine (148 mL), or one 1 oz glass of hard liquor (44 mL).  Lifestyle Take daily care of your teeth and gums. Brush your teeth every morning and night with fluoride toothpaste. Floss one time each day. Stay active. Exercise for at least 30 minutes 5 or more days each week. Do not use any products that contain nicotine or tobacco, such as cigarettes, e-cigarettes, and chewing tobacco. If you need help quitting, ask your health care provider. Do not use drugs. If you are sexually active, practice safe sex. Use a condom or other form of protection to prevent STIs (sexually transmitted infections). If you do not wish to become pregnant, use a form of birth control. If you plan to become pregnant, see your health care   provider for a prepregnancy visit. Find healthy ways to cope with stress, such as: Meditation, yoga, or listening to music. Journaling. Talking to a trusted person. Spending time with friends and family. Safety Always wear your seat belt while driving or riding in a  vehicle. Do not drive: If you have been drinking alcohol. Do not ride with someone who has been drinking. When you are tired or distracted. While texting. Wear a helmet and other protective equipment during sports activities. If you have firearms in your house, make sure you follow all gun safety procedures. Seek help if you have been physically or sexually abused. What's next? Go to your health care provider once a year for an annual wellness visit. Ask your health care provider how often you should have your eyes and teeth checked. Stay up to date on all vaccines. This information is not intended to replace advice given to you by your health care provider. Make sure you discuss any questions you have with your healthcare provider. Document Revised: 05/23/2020 Document Reviewed: 06/06/2018 Elsevier Patient Education  2022 [employer] Breast self-awareness means being familiar with how your breasts look and feel. It involves checking your breasts regularly and reporting any changes to yourhealth care provider. Practicing breast self-awareness is important. Sometimes changes may not be harmful (are benign), but sometimes a change in your breasts can be a sign of a serious medical problem. It is important to learn how to do this procedure correctly so that you can catch problems early, when treatment is more likely to be successful. All women should practice breast self-awareness, including women who have hadbreast implants. What you need: A mirror. A well-lit room. How to do a breast self-exam A breast self-exam is one way to learn what is normal for your breasts andwhether your breasts are changing. To do a breast self-exam: Look for changes  Remove all the clothing above your waist. Stand in front of a mirror in a room with good lighting. Put your hands on your hips. Push your hands firmly downward. Compare your breasts in the mirror. Look for  differences between them (asymmetry), such as: Differences in shape. Differences in size. Puckers, dips, and bumps in one breast and not the other. Look at each breast for changes in the skin, such as: Redness. Scaly areas. Look for changes in your nipples, such as: Discharge. Bleeding. Dimpling. Redness. A change in position.  Feel for changes Carefully feel your breasts for lumps and changes. It is best to do this while lying on your back on the floor, and again while sitting or standing in the tub or shower with soapy water on your skin. Feel each breast in the following way: Place the arm on the side of the breast you are examining above your head. Feel your breast with the other hand. Start in the nipple area and make -inch (2 cm) overlapping circles to feel your breast. Use the pads of your three middle fingers to do this. Apply light pressure, then medium pressure, then firm pressure. The light pressure will allow you to feel the tissue closest to the skin. The medium pressure will allow you to feel the tissue that is a little deeper. The firm pressure will allow you to feel the tissue close to the ribs. Continue the overlapping circles, moving downward over the breast until you feel your ribs below your breast. Move one finger-width toward the center of the body. Continue to  use the -inch (2 cm) overlapping circles to feel your breast as you move slowly up toward your collarbone. Continue the up-and-down exam using all three pressures until you reach your armpit.  Write down what you find Writing down what you find can help you remember what to discuss with your health care provider. Write down: What is normal for each breast. Any changes that you find in each breast, including: The kind of changes you find. Any pain or tenderness. Size and location of any lumps. Where you are in your menstrual cycle, if you are still menstruating. General tips and recommendations Examine  your breasts every month. If you are breastfeeding, the best time to examine your breasts is after a feeding or after using a breast pump. If you menstruate, the best time to examine your breasts is 5-7 days after your period. Breasts are generally lumpier during menstrual periods, and it may be more difficult to notice changes. With time and practice, you will become more familiar with the variations in your breasts and more comfortable with the exam. Contact a health care provider if you: See a change in the shape or size of your breasts or nipples. See a change in the skin of your breast or nipples, such as a reddened or scaly area. Have unusual discharge from your nipples. Find a lump or thick area that was not there before. Have pain in your breasts. Have any concerns related to your breast health. Summary Breast self-awareness includes looking for physical changes in your breasts, as well as feeling for any changes within your breasts. Breast self-awareness should be performed in front of a mirror in a well-lit room. You should examine your breasts every month. If you menstruate, the best time to examine your breasts is 5-7 days after your menstrual period. Let your health care provider know of any changes you notice in your breasts, including changes in size, changes on the skin, pain or tenderness, or unusual fluid from your nipples. This information is not intended to replace advice given to you by your health care provider. Make sure you discuss any questions you have with your healthcare provider. Document Revised: 05/14/2018 Document Reviewed: 05/14/2018 Elsevier Patient Education  [place].

## 2021-05-25 NOTE — Progress Notes (Signed)
Pt presents to office today scheduled for an annual, but reports to RN on intake that she had a positive pregnancy test at home two months ago, but does not know when her LMP was as she was breastfeeding and having irregular cycles. Annual visit deferred today in lieu of dating [name], RN intake and NOB labs. Patient does not need Pap today. Will be rescheduled in two weeks for NOB visit with provider. Pt reports is comfortable with that plan.  Marylen Ponto, NP  4:13 PM 05/26/2021

## 2021-05-27 LAB — OBSTETRIC PANEL, INCLUDING HIV
Antibody Screen: NEGATIVE
Basophils Absolute: 0.1 10*3/uL (ref 0.0–0.2)
Basos: 1 %
EOS (ABSOLUTE): 0.1 10*3/uL (ref 0.0–0.4)
Eos: 1 %
HIV Screen 4th Generation wRfx: NONREACTIVE
Hematocrit: 35.2 % (ref 34.0–46.6)
Hemoglobin: 11.5 g/dL (ref 11.1–15.9)
Hepatitis B Surface Ag: NEGATIVE
Immature Grans (Abs): 0.1 10*3/uL (ref 0.0–0.1)
Immature Granulocytes: 1 %
Lymphocytes Absolute: 2 10*3/uL (ref 0.7–3.1)
Lymphs: 23 %
MCH: 29 pg (ref 26.6–33.0)
MCHC: 32.7 g/dL (ref 31.5–35.7)
MCV: 89 fL (ref 79–97)
Monocytes Absolute: 0.6 10*3/uL (ref 0.1–0.9)
Monocytes: 7 %
Neutrophils Absolute: 5.8 10*3/uL (ref 1.4–7.0)
Neutrophils: 67 %
Platelets: 309 10*3/uL (ref 150–450)
RBC: 3.97 x10E6/uL (ref 3.77–5.28)
RDW: 12.7 % (ref 11.7–15.4)
RPR Ser Ql: NONREACTIVE
Rh Factor: POSITIVE
Rubella Antibodies, IGG: 4.05 index (ref >0.99)
WBC: 8.7 10*3/uL (ref 3.4–10.8)

## 2021-05-27 LAB — CERVICOVAGINAL ANCILLARY ONLY
Bacterial Vaginitis (gardnerella): NEGATIVE
Candida Glabrata: NEGATIVE
Candida Vaginitis: POSITIVE — AB
Chlamydia: NEGATIVE
Comment: NEGATIVE
Comment: NEGATIVE
Comment: NEGATIVE
Comment: NEGATIVE
Comment: NEGATIVE
Comment: NORMAL
Neisseria Gonorrhea: NEGATIVE
Trichomonas: NEGATIVE

## 2021-05-27 LAB — HEPATITIS C ANTIBODY: Hep C Virus Ab: <0.1 s/co ratio (ref 0.0–0.9)

## 2021-05-29 ENCOUNTER — Encounter: Payer: Self-pay | Admitting: Women's Health

## 2021-05-29 DIAGNOSIS — R8271 Bacteriuria: Secondary | ICD-10-CM | POA: Insufficient documentation

## 2021-05-29 LAB — URINE CULTURE, OB REFLEX

## 2021-05-29 LAB — CULTURE, OB URINE

## 2021-06-02 ENCOUNTER — Telehealth: Payer: Self-pay

## 2021-06-02 NOTE — Telephone Encounter (Signed)
[employer] form corrected online.  Was missing info."EDD"

## 2021-06-06 ENCOUNTER — Encounter: Payer: Self-pay | Admitting: Women's Health

## 2021-06-08 ENCOUNTER — Ambulatory Visit (INDEPENDENT_AMBULATORY_CARE_PROVIDER_SITE_OTHER): Payer: Medicaid Other | Admitting: Women's Health

## 2021-06-08 ENCOUNTER — Other Ambulatory Visit: Payer: Self-pay

## 2021-06-08 VITALS — BP 98/58 | HR 77 | Wt 118.0 lb

## 2021-06-08 DIAGNOSIS — Z3481 Encounter for supervision of other normal pregnancy, first trimester: Secondary | ICD-10-CM

## 2021-06-08 DIAGNOSIS — Z3A13 13 weeks gestation of pregnancy: Secondary | ICD-10-CM

## 2021-06-08 DIAGNOSIS — R8271 Bacteriuria: Secondary | ICD-10-CM

## 2021-06-08 NOTE — Progress Notes (Signed)
History:   Jacqueline Mitchell is a 25 y.o. G2P1001 at 47w3dby early ultrasound being seen today for her first obstetrical visit.  Her obstetrical history is significant for  none . Patient does not intend to breast feed. Pregnancy history fully reviewed.  Allergies: NKDA Current Medications: none PMH: none. No HTN, DM, asthma. PSH: none OB Hx: 2020 (full-term, NSVD, no problems) Social Hx: pt does not smoke, drink, or use drugs. Family Hx: none Pt received flu vaccine.  PHQ9 SCORE ONLY 05/25/2021  PHQ-9 Total Score 0   GAD 7 : Generalized Anxiety Score 05/25/2021  Nervous, Anxious, on Edge 0  Control/stop worrying 0  Worry too much - different things 0  Trouble relaxing 0  Restless 0  Easily annoyed or irritable 0  Afraid - awful might happen 0  Total GAD 7 Score 0    Patient reports no complaints.      HISTORY: OB History  Gravida Para Term Preterm AB Living  2 1 1  0 0 1  SAB IAB Ectopic Multiple Live Births  0 0 0 0 1    # Outcome Date GA Lbr Len/2nd Weight Sex Delivery Anes PTL Lv  2 Current           1 Term 06/20/19 [id number] / 04:37 7 lb 7.4 oz (3.385 kg) F Vag-Vacuum EPI  LIV     Name: Soller,GIRL Devynne     Apgar1: 7  Apgar5: 9    Last pap smear was done 08/2019 and was normal  Past Medical History:  Diagnosis Date   Medical history non-contributory    Past Surgical History:  Procedure Laterality Date   NO PAST SURGERIES     Family History  Problem Relation Age of Onset   Diabetes Mother    [place]66 [address]    Social History   Tobacco Use   Smoking status: Never   Smokeless tobacco: Never  Vaping Use   Vaping Use: Never used  Substance Use Topics   Alcohol use: Not Currently    Comment: Occasional   Drug use: Never   No Known Allergies Current Outpatient Medications on File Prior to Visit  Medication Sig Dispense Refill   Blood Pressure Monitoring (BLOOD PRESSURE KIT) DEVI 1 kit by Does not apply route once a week. 1 each 0    hydrOXYzine (ATARAX/VISTARIL) 25 MG tablet Take 1 tablet (25 mg total) by mouth every 8 (eight) hours as needed for itching. (Patient not taking: No sig reported) 20 tablet 0   Prenatal Vit-Fe Phos-FA-Omega (VITAFOL GUMMIES) 3.33-0.333-34.8 MG CHEW Chew 3 tablets by mouth daily. (Patient not taking: No sig reported) 90 tablet 3   No current facility-administered medications on file prior to visit.    Review of Systems Pertinent items noted in HPI and remainder of comprehensive ROS otherwise negative.  Physical Exam:   Vitals:   06/08/21 0911  BP: (!) 98/58  Pulse: 77  Weight: 118 lb (53.5 kg)   Fetal Heart Rate (bpm): 164  General: well-developed, well-nourished female in no acute distress  Breasts:  Pt declines  Skin: normal coloration and turgor, no rashes  Neurologic: oriented, normal, negative, normal mood  Extremities: normal strength, tone, and muscle mass, ROM of all joints is normal  HEENT PERRLA, extraocular movement intact and sclera clear, anicteric  Neck supple and no masses  Cardiovascular: regular rate and rhythm  Respiratory:  no respiratory distress, normal breath sounds  Abdomen: soft, non-tender; bowel sounds normal; no masses,  no organomegaly  Pelvic: Not indicated    Assessment:    Pregnancy: G2P1001 Patient Active Problem List   Diagnosis Date Noted   GBS bacteriuria 05/29/2021   Encounter for supervision of normal pregnancy in first trimester 05/25/2021   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 GBS bacteriuria - Culture, OB Urine  2. Encounter for supervision of other normal pregnancy in first trimester  3. [redacted] weeks gestation of pregnancy  Initial labs drawn previously. Continue prenatal vitamins. Problem list reviewed and updated. Genetic Screening discussed, NIPS: results reviewed. Ultrasound discussed; fetal anatomic survey: ordered. Anticipatory guidance about prenatal visits given including labs, ultrasounds, and  testing. Discussed usage of Babyscripts and virtual visits as additional source of managing and completing prenatal visits in midst of coronavirus and pandemic.   Encouraged to complete MyChart Registration for her ability to review results, send requests, and have questions addressed.  The nature of [place] [facility] with multiple MDs and Advanced Practice Providers was explained to patient; also emphasized that residents, students are part of our team. Routine obstetric precautions reviewed. Encouraged to seek out care at office or emergency room [facility] preferred) for urgent and/or emergent concerns. Return in about 4 weeks (around 07/06/2021) for in-person LOB/APP OK.     Clarisa Fling, NP  9:46 AM 06/08/2021

## 2021-06-08 NOTE — Patient Instructions (Addendum)
Maternity Assessment Unit (MAU)  The Maternity Assessment Unit (MAU) is located at the [facility] at [facility]. The address is: [address] [place], [place], [place] [address]. Please see map below for additional directions.    The Maternity Assessment Unit is designed to help you during your pregnancy, and for up to 6 weeks after delivery, with any pregnancy- or postpartum-related emergencies, if you think you are in labor, or if your water has broken. For example, if you experience nausea and vomiting, vaginal bleeding, severe abdominal or pelvic pain, elevated blood pressure or other problems related to your pregnancy or postpartum time, please come to the Maternity Assessment Unit for assistance.       Second Trimester of Pregnancy The second trimester of pregnancy is from week 13 through week 27. This is months 4 through 6 of pregnancy. The second trimester is often a time when you feel your best. Your body has adjusted to being pregnant, and you begin to feel better physically. During the second trimester: Morning sickness has lessened or stopped completely. You may have more energy. You may have an increase in appetite. The second trimester is also a time when the unborn baby (fetus) is growing rapidly. At the end of the sixth month, the fetus may be up to 12 inches long and weigh about 1 pounds. You will likely begin to feel the baby move (quickening) between 16 and 20 weeks of pregnancy. Body changes during your second trimester Your body continues to go through many changes during your second trimester. The changes vary and generally return to normal after the baby is born. Physical changes Your weight will continue to increase. You will notice your lower abdomen bulging out. You may begin to get stretch marks on your hips, abdomen, and breasts. Your breasts will continue to grow and to become tender. Dark spots or blotches (chloasma or  mask of pregnancy) may develop on your face. A dark line from your belly button to the pubic area (linea nigra) may appear. You may have changes in your hair. These can include thickening of your hair, rapid growth, and changes in texture. Some people also have hair loss during or after pregnancy, or hair that feels dry or thin. Health changes You may develop headaches. You may have heartburn. You may develop constipation. You may develop hemorrhoids or swollen, bulging veins (varicose veins). Your gums may bleed and may be sensitive to brushing and flossing. You may urinate more often because the fetus is pressing on your bladder. You may have back pain. This is caused by: Weight gain. Pregnancy hormones that are relaxing the joints in your pelvis. A shift in weight and the muscles that support your balance. Follow these instructions at home: Medicines Follow your health care provider's instructions regarding medicine use. Specific medicines may be either safe or unsafe to take during pregnancy. Do not take any medicines unless approved by your health care provider. Take a prenatal vitamin that contains at least 600 micrograms (mcg) of folic acid. Eating and drinking Eat a healthy diet that includes fresh fruits and vegetables, whole grains, good sources of protein such as meat, eggs, or tofu, and low-fat dairy products. Avoid raw meat and unpasteurized juice, milk, and cheese. These carry germs that can harm you and your baby. You may need to take these actions to prevent or treat constipation: Drink enough fluid to keep your urine pale yellow. Eat foods that are high in fiber, such  as beans, whole grains, and fresh fruits and vegetables. Limit foods that are high in fat and processed sugars, such as fried or sweet foods. Activity Exercise only as directed by your health care provider. Most people can continue their usual exercise routine during pregnancy. Try to exercise for 30 minutes  at least 5 days a week. Stop exercising if you develop contractions in your uterus. Stop exercising if you develop pain or cramping in the lower abdomen or lower back. Avoid exercising if it is very hot or humid or if you are at a high altitude. Avoid heavy lifting. If you choose to, you may have sex unless your health care provider tells you not to. Relieving pain and discomfort Wear a supportive bra to prevent discomfort from breast tenderness. Take warm sitz baths to soothe any pain or discomfort caused by hemorrhoids. Use hemorrhoid cream if your health care provider approves. Rest with your legs raised (elevated) if you have leg cramps or low back pain. If you develop varicose veins: Wear support hose as told by your health care provider. Elevate your feet for 15 minutes, 3-4 times a day. Limit salt in your diet. Safety Wear your seat belt at all times when driving or riding in a car. Talk with your health care provider if someone is verbally or physically abusive to you. Lifestyle Do not use hot tubs, steam rooms, or saunas. Do not douche. Do not use tampons or scented sanitary pads. Avoid cat litter boxes and soil used by cats. These carry germs that can cause birth defects in the baby and possibly loss of the fetus by miscarriage or stillbirth. Do not use herbal remedies, alcohol, illegal drugs, or medicines that are not approved by your health care provider. Chemicals in these products can harm your baby. Do not use any products that contain nicotine or tobacco, such as cigarettes, e-cigarettes, and chewing tobacco. If you need help quitting, ask your health care provider. General instructions During a routine prenatal visit, your health care provider will do a physical exam and other tests. He or she will also discuss your overall health. Keep all follow-up visits. This is important. Ask your health care provider for a referral to a local prenatal education class. Ask for help if  you have counseling or nutritional needs during pregnancy. Your health care provider can offer advice or refer you to specialists for help with various needs. Where to find more information American Pregnancy Association: americanpregnancy.org [employer]ologists: https://www.todd-brady.net/ Office on [employer] Health: MightyReward.co.nz Contact a health care provider if you have: A headache that does not go away when you take medicine. Vision changes or you see spots in front of your eyes. Mild pelvic cramps, pelvic pressure, or nagging pain in the abdominal area. Persistent nausea, vomiting, or diarrhea. A bad-smelling vaginal discharge or foul-smelling urine. Pain when you urinate. Sudden or extreme swelling of your face, hands, ankles, feet, or legs. A fever. Get help right away if you: Have fluid leaking from your vagina. Have spotting or bleeding from your vagina. Have severe abdominal cramping or pain. Have difficulty breathing. Have chest pain. Have fainting spells. Have not felt your baby move for the time period told by your health care provider. Have new or increased pain, swelling, or redness in an arm or leg. Summary The second trimester of pregnancy is from week 13 through week 27 (months 4 through 6). Do not use herbal remedies, alcohol, illegal drugs, or medicines that are not  approved by your health care provider. Chemicals in these products can harm your baby. Exercise only as directed by your health care provider. Most people can continue their usual exercise routine during pregnancy. Keep all follow-up visits. This is important. This information is not intended to replace advice given to you by your health care provider. Make sure you discuss any questions you have with your health care provider. Document Revised: 03/03/2020 Document Reviewed: 01/08/2020 Elsevier Patient Education  2022 Elsevier Inc.       Round  Ligament Pain The round ligaments are a pair of cord-like tissues that help support the uterus. They can become a source of pain during pregnancy as the ligaments soften and stretch as the baby grows. The pain usually begins in the second trimester (13-28 weeks) of pregnancy, and should only last for a few seconds when it occurs. However, the pain can come and go until the baby is delivered. The pain does not cause harm to the baby. Round ligament pain is usually a short, sharp, and pinching pain, but it can also be a dull, lingering, and aching pain. The pain is felt in the lower side of the abdomen or in the groin. It usually starts deep in the groin and moves up to the outside of the hip area. The pain may happen when you: Suddenly change position, such as quickly going from a sitting to standing position. Do physical activity. Cough or sneeze. Follow these instructions at home: Managing pain  When the pain starts, relax. Then, try any of these methods to help with the pain: Sit down. Flex your knees up to your abdomen. Lie on your side with one pillow under your abdomen and another pillow between your legs. Sit in a warm bath for 15-20 minutes or until the pain goes away. General instructions Watch your condition for any changes. Move slowly when you sit down or stand up. Stop or reduce your physical activities if they cause pain. Avoid long walks if they cause pain. Take over-the-counter and prescription medicines only as told by your health care provider. Keep all follow-up visits. This is important. Contact a health care provider if: Your pain does not go away with treatment. You feel pain in your back that you did not have before. Your medicine is not helping. You have a fever or chills. You have nausea or vomiting. You have diarrhea. You have pain when you urinate. Get help right away if: You have pain that is a rhythmic, cramping pain similar to labor pains. Labor pains are  usually 2 minutes apart, last for about 1 minute, and involve a bearing down feeling or pressure in your pelvis. You have vaginal bleeding. These symptoms may represent a serious problem that is an emergency. Do not wait to see if the symptoms will go away. Get medical help right away. Call your local emergency services (911 in the [place] Do not drive yourself to the hospital. Summary Round ligament pain is felt in the lower abdomen or groin. This pain usually begins in the second trimester (13-28 weeks) and should only last for a few seconds when it occurs. You may notice the pain when you suddenly change position, when you cough or sneeze, or during physical activity. Relaxing, flexing your knees to your abdomen, lying on one side, or taking a warm bath may help to get rid of the pain. Contact your health care provider if the pain does not go away. This information is not intended to replace  advice given to you by your health care provider. Make sure you discuss any questions you have with your health care provider. Document Revised: 12/08/2020 Document Reviewed: 12/08/2020 Elsevier Patient Education  2022 Elsevier Inc.                        Safe Medications in Pregnancy    Acne: Benzoyl Peroxide Salicylic Acid  Backache/Headache: Tylenol: 2 regular strength every 4 hours OR              2 Extra strength every 6 hours  Colds/Coughs/Allergies: Benadryl (alcohol free) 25 mg every 6 hours as needed Breath right strips Claritin Cepacol throat lozenges Chloraseptic throat spray Cold-Eeze- up to three times per day Cough drops, alcohol free Flonase (by prescription only) Guaifenesin Mucinex Robitussin DM (plain only, alcohol free) Saline nasal spray/drops Sudafed (pseudoephedrine) & Actifed ** use only after [redacted] weeks gestation and if you do not have high blood pressure Tylenol Vicks Vaporub Zinc lozenges Zyrtec   Constipation: Colace Ducolax suppositories Fleet  enema Glycerin suppositories Metamucil Milk of magnesia Miralax Senokot Smooth move tea  Diarrhea: Kaopectate Imodium A-D  *NO pepto Bismol  Hemorrhoids: Anusol Anusol HC Preparation H Tucks  Indigestion: Tums Maalox Mylanta Zantac  Pepcid  Insomnia: Benadryl (alcohol free) 25mg  every 6 hours as needed Tylenol PM Unisom, no Gelcaps  Leg Cramps: Tums MagGel  Nausea/Vomiting:  Bonine Dramamine Emetrol Ginger extract Sea bands Meclizine  Nausea medication to take during pregnancy:  Unisom (doxylamine succinate 25 mg tablets) Take one tablet daily at bedtime. If symptoms are not adequately controlled, the dose can be increased to a maximum recommended dose of two tablets daily (1/2 tablet in the morning, 1/2 tablet mid-afternoon and one at bedtime). Vitamin B6 100mg  tablets. Take one tablet twice a day (up to 200 mg per day).  Skin Rashes: Aveeno products Benadryl cream or 25mg  every 6 hours as needed Calamine Lotion 1% cortisone cream  Yeast infection: Gyne-lotrimin 7 Monistat 7   **If taking multiple medications, please check labels to avoid duplicating the same active ingredients **take medication as directed on the label ** Do not exceed 4000 mg of tylenol in 24 hours **Do not take medications that contain aspirin or ibuprofen         Childbirth Education Options: [facility] Department Classes:  Childbirth education classes can help you get ready for a positive parenting experience. You can also meet other expectant parents and get free stuff for your baby. Each class runs for five weeks on the same night and costs $45 for the mother-to-be and her support person. Medicaid covers the cost if you are eligible. Call [phone] to register. [facility] Childbirth Education: Classes can vary in availability and schedule is subject to change. For most up-to-date information please visit [email or web address] to review  and register.

## 2021-06-08 NOTE — Progress Notes (Signed)
No complaints today. Pt needs repeat urine culture.

## 2021-06-10 LAB — CULTURE, OB URINE

## 2021-06-10 LAB — URINE CULTURE, OB REFLEX

## 2021-07-06 ENCOUNTER — Encounter: Payer: Self-pay | Admitting: Obstetrics and Gynecology

## 2021-07-06 ENCOUNTER — Ambulatory Visit (INDEPENDENT_AMBULATORY_CARE_PROVIDER_SITE_OTHER): Payer: Medicaid Other | Admitting: Obstetrics and Gynecology

## 2021-07-06 ENCOUNTER — Other Ambulatory Visit: Payer: Self-pay

## 2021-07-06 VITALS — BP 97/4 | HR 80 | Wt 117.5 lb

## 2021-07-06 DIAGNOSIS — Z23 Encounter for immunization: Secondary | ICD-10-CM | POA: Diagnosis not present

## 2021-07-06 DIAGNOSIS — Z3481 Encounter for supervision of other normal pregnancy, first trimester: Secondary | ICD-10-CM

## 2021-07-06 DIAGNOSIS — R8271 Bacteriuria: Secondary | ICD-10-CM

## 2021-07-06 DIAGNOSIS — Z3400 Encounter for supervision of normal first pregnancy, unspecified trimester: Secondary | ICD-10-CM

## 2021-07-06 LAB — OB RESULTS CONSOLE GBS: GBS: POSITIVE

## 2021-07-06 MED ORDER — VITAFOL GUMMIES 3.33-0.333-34.8 MG PO CHEW: 3.0000 | CHEWABLE_TABLET | Freq: Every day | ORAL | 3 refills | Status: DC

## 2021-07-06 NOTE — Patient Instructions (Signed)

## 2021-07-06 NOTE — Progress Notes (Signed)
Pt reports fetal movement with occasional cramping, no bleeding.

## 2021-07-06 NOTE — Progress Notes (Signed)
Subjective:  Jacqueline Mitchell is a 25 y.o. G2P1001 at [redacted]w[redacted]d being seen today for ongoing prenatal care.  She is currently monitored for the following issues for this low-risk pregnancy and has Secondary anovulatory infertility; Encounter for supervision of normal pregnancy in first trimester; and GBS bacteriuria on their problem list.  Patient reports no complaints.  Contractions: Irritability. Vag. Bleeding: None.  Movement: Present. Denies leaking of fluid.   The following portions of the patient's history were reviewed and updated as appropriate: allergies, current medications, past family history, past medical history, past social history, past surgical history and problem list. Problem list updated.  Objective:   Vitals:   07/06/21 0942  BP: (!) 97/4  Pulse: 80  Weight: 117 lb 8 oz (53.3 kg)    Fetal Status: Fetal Heart Rate (bpm): 154   Movement: Present     General:  Alert, oriented and cooperative. Patient is in no acute distress.  Skin: Skin is warm and dry. No rash noted.   Cardiovascular: Normal heart rate noted  Respiratory: Normal respiratory effort, no problems with respiration noted  Abdomen: Soft, gravid, appropriate for gestational age. Pain/Pressure: Present     Pelvic:  Cervical exam deferred        Extremities: Normal range of motion.  Edema: None  Mental Status: Normal mood and affect. Normal behavior. Normal judgment and thought content.   Urinalysis:      Assessment and Plan:  Pregnancy: G2P1001 at [redacted]w[redacted]d  1. Encounter for supervision of other normal pregnancy in first trimester Stable Flu vaccine today - AFP, Serum, Open Spina Bifida - [name] MFM OB COMP + 14 WK; Future  2. GBS bacteriuria Tx while in labor  Preterm labor symptoms and general obstetric precautions including but not limited to vaginal bleeding, contractions, leaking of fluid and fetal movement were reviewed in detail with the patient. Please refer to After Visit Summary for other counseling  recommendations.  Return in about 4 weeks (around 08/03/2021) for OB visit, face to face, any provider.   Hermina Staggers, MD

## 2021-07-08 LAB — AFP, SERUM, OPEN SPINA BIFIDA
AFP MoM: 1.37
AFP Value: 65 ng/mL
Gest. Age on Collection Date: 17 weeks
Maternal Age At EDD: 23.7 yr
OSBR Risk 1 IN: 3920
Test Results:: NEGATIVE
Weight: 117 [lb_av]

## 2021-07-20 ENCOUNTER — Ambulatory Visit: Payer: Medicaid Other | Attending: Obstetrics and Gynecology

## 2021-07-20 ENCOUNTER — Ambulatory Visit (HOSPITAL_BASED_OUTPATIENT_CLINIC_OR_DEPARTMENT_OTHER): Payer: Medicaid Other | Admitting: Obstetrics and Gynecology

## 2021-07-20 ENCOUNTER — Other Ambulatory Visit: Payer: Self-pay | Admitting: *Deleted

## 2021-07-20 ENCOUNTER — Other Ambulatory Visit: Payer: Self-pay | Admitting: Obstetrics and Gynecology

## 2021-07-20 ENCOUNTER — Other Ambulatory Visit: Payer: Self-pay

## 2021-07-20 DIAGNOSIS — Z3481 Encounter for supervision of other normal pregnancy, first trimester: Secondary | ICD-10-CM | POA: Diagnosis not present

## 2021-07-20 DIAGNOSIS — Z3A19 19 weeks gestation of pregnancy: Secondary | ICD-10-CM

## 2021-07-20 DIAGNOSIS — Z3689 Encounter for other specified antenatal screening: Secondary | ICD-10-CM

## 2021-07-20 DIAGNOSIS — O283 Abnormal ultrasonic finding on antenatal screening of mother: Secondary | ICD-10-CM

## 2021-07-20 DIAGNOSIS — Z362 Encounter for other antenatal screening follow-up: Secondary | ICD-10-CM

## 2021-07-20 NOTE — Progress Notes (Signed)
Maternal-Fetal Medicine   Name: Ardice Boyan DOB: [birth year] MRN: [id number] Referring Provider: Nettie Elm, MD  I had the pleasure of seeing Ms. Dusenbury [facility]. She is G2 P1 at 83w 3d gestation and is here for fetal anatomy scan.  On cell free fetal DNA screening, the risks of fetal aneuploidies are not increased.  MSAFP screening showed low risk for open neural tube defects.  Obstetric history significant for a term vacuum-assisted vaginal delivery of a female infant weighing 7 pounds and 7 ounces at birth.  Her daughter is in good health.  Patient reports no chronic medical conditions.  Ultrasound We performed fetal anatomical survey.  Fetal biometry is consistent with the previously established dates.  Amniotic fluid is normal and good fetal activity seen.  The nuchal fold thickness is increased.  No other markers of aneuploidies or fetal structural defects are seen.  Cardiac anatomy appears normal.  Our concerns include: Thick nuchal fold It is seen up to 50% of fetuses with Down syndrome.  Thick nuchal fold is also seen and after 2% of euploid fetuses.  I explained the finding and counseled her on the significance and limitations of cell free fetal DNA screening.  She had low risk for fetal Down syndrome on cell free fetal DNA screening. I informed her that only amniocentesis will give a definitive result on the fetal karyotype.  I explained the procedure and possible complication of miscarriage and 1 and 500 procedures.  I informed the patient that I do not recommend amniocentesis for detection of Down syndrome alone since cell free fetal DNA screening has a greater detection rate for Down syndrome and has a very low false negative rate. Patient understands the limitations of ultrasound in detecting aneuploidies and opted not to have amniocentesis.  Recommendations -An appointment was made for her to return in 6 weeks for fetal growth  assessment.  Thank you for consultation.  If you have any questions or concerns, please contact me the [facility].  Consultation including face-to-face (more than 50%) counseling 30 minutes.

## 2021-08-03 ENCOUNTER — Encounter: Payer: Medicaid Other | Admitting: Women's Health

## 2021-08-04 ENCOUNTER — Ambulatory Visit (INDEPENDENT_AMBULATORY_CARE_PROVIDER_SITE_OTHER): Payer: Medicaid Other | Admitting: Advanced Practice Midwife

## 2021-08-04 ENCOUNTER — Other Ambulatory Visit: Payer: Self-pay

## 2021-08-04 VITALS — BP 108/66 | HR 94 | Wt 125.0 lb

## 2021-08-04 DIAGNOSIS — Z3481 Encounter for supervision of other normal pregnancy, first trimester: Secondary | ICD-10-CM

## 2021-08-04 DIAGNOSIS — O99311 Alcohol use complicating pregnancy, first trimester: Secondary | ICD-10-CM | POA: Insufficient documentation

## 2021-08-04 DIAGNOSIS — Z3A21 21 weeks gestation of pregnancy: Secondary | ICD-10-CM

## 2021-08-04 DIAGNOSIS — O283 Abnormal ultrasonic finding on antenatal screening of mother: Secondary | ICD-10-CM | POA: Insufficient documentation

## 2021-08-04 DIAGNOSIS — R8271 Bacteriuria: Secondary | ICD-10-CM

## 2021-08-04 NOTE — Progress Notes (Signed)
   PRENATAL VISIT NOTE  Subjective:  Jacqueline Mitchell is a 25 y.o. G2P1001 at [redacted]w[redacted]d being seen today for ongoing prenatal care.  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  Patient reports no complaints.  Contractions: Not present. Vag. Bleeding: None.  Movement: Present. Denies leaking of fluid.   The following portions of the patient's history were reviewed and updated as appropriate: allergies, current medications, past family history, past medical history, past social history, past surgical history and problem list.   Objective:   Vitals:   08/04/21 1357  BP: 108/66  Pulse: 94  Weight: 125 lb (56.7 kg)    Fetal Status: Fetal Heart Rate (bpm): 165   Movement: Present     General:  Alert, oriented and cooperative. Patient is in no acute distress.  Skin: Skin is warm and dry. No rash noted.   Cardiovascular: Normal heart rate noted  Respiratory: Normal respiratory effort, no problems with respiration noted  Abdomen: Soft, gravid, appropriate for gestational age.  Pain/Pressure: Absent     Pelvic: Cervical exam deferred        Extremities: Normal range of motion.     Mental Status: Normal mood and affect. Normal behavior. Normal judgment and thought content.   Assessment and Plan:  Pregnancy: G2P1001 at [redacted]w[redacted]d 1. Encounter for supervision of other normal pregnancy in first trimester --Anticipatory guidance about next visits/weeks of pregnancy given. --Next appt in 4 weeks  2. GBS bacteriuria   3. [redacted] weeks gestation of pregnancy   4. Nuchal fold thickening on prenatal ultrasound --NIPS wnl, counseled with MFM, declined amniocentesis  --Repeat US scheduled  5. Alcohol use affecting pregnancy in first  trimester --Pt concerned because she drank alcohol in the first trimester before she knew she was pregnant. She was on Depo and thought it would take a few months to get pregnant, but got pregnant right away. --No s/sx of FAS seen on [name], reassurance provided to pt, f/u US as scheduled  Preterm labor symptoms and general obstetric precautions including but not limited to vaginal bleeding, contractions, leaking of fluid and fetal movement were reviewed in detail with the patient. Please refer to After Visit Summary for other counseling recommendations.   Return in about 4 weeks (around 09/01/2021).  Future Appointments  Date Time Provider Department Center  08/31/2021 11:00 AM [facility]  09/07/2021 11:15 AM Nugent, Odie Sera, NP [facility] None    Sharen Counter, CNM

## 2021-08-31 ENCOUNTER — Other Ambulatory Visit: Payer: Self-pay | Admitting: *Deleted

## 2021-08-31 ENCOUNTER — Ambulatory Visit: Payer: Medicaid Other | Attending: Obstetrics and Gynecology

## 2021-08-31 ENCOUNTER — Other Ambulatory Visit: Payer: Self-pay

## 2021-08-31 DIAGNOSIS — Z3A25 25 weeks gestation of pregnancy: Secondary | ICD-10-CM

## 2021-08-31 DIAGNOSIS — Z3689 Encounter for other specified antenatal screening: Secondary | ICD-10-CM | POA: Diagnosis not present

## 2021-08-31 DIAGNOSIS — O283 Abnormal ultrasonic finding on antenatal screening of mother: Secondary | ICD-10-CM | POA: Insufficient documentation

## 2021-08-31 DIAGNOSIS — Z362 Encounter for other antenatal screening follow-up: Secondary | ICD-10-CM | POA: Diagnosis present

## 2021-08-31 DIAGNOSIS — O358XX0 Maternal care for other (suspected) fetal abnormality and damage, not applicable or unspecified: Secondary | ICD-10-CM | POA: Diagnosis not present

## 2021-09-07 ENCOUNTER — Ambulatory Visit (INDEPENDENT_AMBULATORY_CARE_PROVIDER_SITE_OTHER): Payer: Medicaid Other | Admitting: Women's Health

## 2021-09-07 DIAGNOSIS — O99311 Alcohol use complicating pregnancy, first trimester: Secondary | ICD-10-CM

## 2021-09-07 DIAGNOSIS — Z3A26 26 weeks gestation of pregnancy: Secondary | ICD-10-CM

## 2021-09-07 DIAGNOSIS — O283 Abnormal ultrasonic finding on antenatal screening of mother: Secondary | ICD-10-CM

## 2021-09-07 NOTE — Progress Notes (Signed)
Pt visit cancelled.

## 2021-09-14 ENCOUNTER — Other Ambulatory Visit: Payer: Self-pay

## 2021-09-14 ENCOUNTER — Encounter: Payer: Self-pay | Admitting: Obstetrics

## 2021-09-14 ENCOUNTER — Other Ambulatory Visit: Payer: Medicaid Other

## 2021-09-14 ENCOUNTER — Ambulatory Visit (INDEPENDENT_AMBULATORY_CARE_PROVIDER_SITE_OTHER): Payer: Medicaid Other | Admitting: Obstetrics

## 2021-09-14 VITALS — BP 90/58 | HR 89 | Wt 131.2 lb

## 2021-09-14 DIAGNOSIS — Z23 Encounter for immunization: Secondary | ICD-10-CM | POA: Diagnosis not present

## 2021-09-14 DIAGNOSIS — Z3481 Encounter for supervision of other normal pregnancy, first trimester: Secondary | ICD-10-CM

## 2021-09-14 NOTE — Progress Notes (Signed)
[name] 27.[redacted] wks GA GTT, CBC, HIV, RPR today Information given on TDAP, deciding PHQ9=7, GAD7=0, declines counseling

## 2021-09-14 NOTE — Progress Notes (Signed)
Subjective:  Jacqueline Mitchell is a 25 y.o. G2P1001 at [redacted]w[redacted]d being seen today for ongoing prenatal care.  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  Patient reports backache.  Contractions: Not present. Vag. Bleeding: None.  Movement: Present. Denies leaking of fluid.   The following portions of the patient's history were reviewed and updated as appropriate: allergies, current medications, past family history, past medical history, past social history, past surgical history and problem list. Problem list updated.  Objective:   Vitals:   09/14/21 0904  BP: (!) 90/58  Pulse: 89  Weight: 131 lb 3.2 oz (59.5 kg)    Fetal Status: Fetal Heart Rate (bpm): 158   Movement: Present     General:  Alert, oriented and cooperative. Patient is in no acute distress.  Skin: Skin is warm and dry. No rash noted.   Cardiovascular: Normal heart rate noted  Respiratory: Normal respiratory effort, no problems with respiration noted  Abdomen: Soft, gravid, appropriate for gestational age. Pain/Pressure: Absent     Pelvic:  Cervical exam deferred        Extremities: Normal range of motion.  Edema: None  Mental Status: Normal mood and affect. Normal behavior. Normal judgment and thought content.   Urinalysis:      Assessment and Plan:  Pregnancy: G2P1001 at [redacted]w[redacted]d  1. Encounter for supervision of other normal pregnancy in first trimester Rx: - Glucose Tolerance, 2 Hours w/1 Hour - CBC - RPR - HIV Antibody (routine testing w rflx) - Tdap vaccine greater than or equal to 7yo IM  Preterm labor symptoms and general obstetric precautions including but not limited to vaginal bleeding, contractions, leaking of fluid and fetal movement were reviewed in detail with the  patient. Please refer to After Visit Summary for other counseling recommendations.   Return in about 2 weeks (around 09/28/2021) for [name].   Brock Bad, MD  09/14/21

## 2021-09-15 ENCOUNTER — Other Ambulatory Visit: Payer: Self-pay | Admitting: Obstetrics

## 2021-09-15 DIAGNOSIS — O99019 Anemia complicating pregnancy, unspecified trimester: Secondary | ICD-10-CM

## 2021-09-15 LAB — GLUCOSE TOLERANCE, 2 HOURS W/ 1HR
Glucose, 1 hour: 99 mg/dL (ref 70–179)
Glucose, 2 hour: 95 mg/dL (ref 70–152)
Glucose, Fasting: 71 mg/dL (ref 70–91)

## 2021-09-15 LAB — HIV ANTIBODY (ROUTINE TESTING W REFLEX): HIV Screen 4th Generation wRfx: NONREACTIVE

## 2021-09-15 LAB — CBC
Hematocrit: 31.5 % — ABNORMAL LOW (ref 34.0–46.6)
Hemoglobin: 10.2 g/dL — ABNORMAL LOW (ref 11.1–15.9)
MCH: 28.3 pg (ref 26.6–33.0)
MCHC: 32.4 g/dL (ref 31.5–35.7)
MCV: 87 fL (ref 79–97)
Platelets: 297 10*3/uL (ref 150–450)
RBC: 3.61 x10E6/uL — ABNORMAL LOW (ref 3.77–5.28)
RDW: 12 % (ref 11.7–15.4)
WBC: 13.5 10*3/uL — ABNORMAL HIGH (ref 3.4–10.8)

## 2021-09-15 LAB — RPR: RPR Ser Ql: NONREACTIVE

## 2021-09-15 MED ORDER — IRON POLYSACCH CMPLX-B12-FA 150-0.025-1 MG PO CAPS: 1.0000 | ORAL_CAPSULE | ORAL | 5 refills | Status: DC

## 2021-09-17 ENCOUNTER — Encounter (HOSPITAL_COMMUNITY): Payer: Self-pay | Admitting: Obstetrics and Gynecology

## 2021-09-17 ENCOUNTER — Inpatient Hospital Stay (HOSPITAL_COMMUNITY)
Admission: AD | Admit: 2021-09-17 | Discharge: 2021-09-17 | Discharge status: Against Medical Advice | Payer: Medicaid Other | Attending: Obstetrics and Gynecology | Admitting: Obstetrics and Gynecology

## 2021-09-17 ENCOUNTER — Other Ambulatory Visit: Payer: Self-pay

## 2021-09-17 DIAGNOSIS — O9A212 Injury, poisoning and certain other consequences of external causes complicating pregnancy, second trimester: Secondary | ICD-10-CM | POA: Diagnosis not present

## 2021-09-17 DIAGNOSIS — Z3A27 27 weeks gestation of pregnancy: Secondary | ICD-10-CM | POA: Diagnosis not present

## 2021-09-17 HISTORY — DX: Anemia, unspecified: D64.9

## 2021-09-17 LAB — URINALYSIS, ROUTINE W REFLEX MICROSCOPIC
Bilirubin Urine: NEGATIVE
Glucose, UA: NEGATIVE mg/dL
Hgb urine dipstick: NEGATIVE
Ketones, ur: NEGATIVE mg/dL
Nitrite: NEGATIVE
Protein, ur: NEGATIVE mg/dL
Specific Gravity, Urine: 1.026 (ref 1.005–1.030)
pH: 6 (ref 5.0–8.0)
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
## 2021-09-17 NOTE — ED Notes (Signed)
Called pt multiple times and even checked outside, had triage come out to locate pt unsuccessful.
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
## 2021-09-17 NOTE — MAU Note (Signed)
Now feeling movement

## 2021-09-28 ENCOUNTER — Other Ambulatory Visit: Payer: Self-pay

## 2021-09-28 ENCOUNTER — Ambulatory Visit (INDEPENDENT_AMBULATORY_CARE_PROVIDER_SITE_OTHER): Payer: Medicaid Other | Admitting: Women's Health

## 2021-09-28 VITALS — BP 106/68 | HR 120 | Wt 134.0 lb

## 2021-09-28 DIAGNOSIS — R8271 Bacteriuria: Secondary | ICD-10-CM

## 2021-09-28 DIAGNOSIS — Z3481 Encounter for supervision of other normal pregnancy, first trimester: Secondary | ICD-10-CM

## 2021-09-28 DIAGNOSIS — Z3A29 29 weeks gestation of pregnancy: Secondary | ICD-10-CM

## 2021-09-28 NOTE — Patient Instructions (Addendum)
Maternity Assessment Unit (MAU)  The Maternity Assessment Unit (MAU) is located at the [facility] at [facility] The address is: [address], [place], [place], [place] [address]. Please see map below for additional directions.    The Maternity Assessment Unit is designed to help you during your pregnancy, and for up to 6 weeks after delivery, with any pregnancy- or postpartum-related emergencies, if you think you are in labor, or if your water has broken. For example, if you experience nausea and vomiting, vaginal bleeding, severe abdominal or pelvic pain, elevated blood pressure or other problems related to your pregnancy or postpartum time, please come to the Maternity Assessment Unit for assistance.       AREA FAMILY PRACTICE PHYSICIANS  [place] ([id number]) [facility] [address] [place], [place] [address] [phone] Mon-Fri 8:30-12:30, 1:30-5:00 Accepting [facility] Family Medicine at [facility] [address], [place], [place] [address] [phone] Mon-Fri 8:00-5:30 [place] [facility] [address], [place], [place] [address] [phone] [place], Thur, Fri 8:30-5:00, Wed 10:00-7:00 (closed 1-2pm) Accepting [facility] [address], [place], [place]  [address] Phone - [phone]   Fax - [phone]  [place] [phone]) [facility] Medicine [address], [place], [place] [address] [phone] Mon-Fri 8:00-5:00 [facility] - [facility]s at [facility])  [address] Sherian Maroon [place], [place] [address] [phone] Mon-Fri 8:30-5:30, Sat (Oct.-Mar.) 9:00-1:00 Accepting Medicaid  [place] [phone]) [facility] Family [facility] at [facility] [address] [place], [place] [address] [phone] Mon-Fri 8:00-5:00  [place] [phone]) [facility] Medicine at  [facility] [address], [place], [place] [address] [phone] Mon-Fri 8:00-5:00 [facility] [place] [address] [place], [place], [place] [address] [phone] Mon-Fri 8:00-5:00 [place] [facility] [address] [place], [place] [address] [phone] Mon-Fri 8:00-5:00 [facility] [address] [place] [address] [phone] Mon-Fri 7:30-5:30  [place] [phone] & [phone]) [facility] [address] [place], [place] [address] [phone] Mon-Thur 8:00-6:00 Accepting [facility] Medicine [address] [place], [place] [address] [phone] Mon-Thur 7:30-7:30, Fri 7:30-4:30 Accepting [facility] Family [facility] at [facility] [address] [place], [place]  [address] [phone]   Fax - [phone]  [place] [phone] & [phone]) Adult nurse HealthCare at [facility] [address] [place], [place] [address] [phone] Mon-Fri 7:00-5:00 [facility] [address], [place], [place] [address] [phone] Mon-Fri 8:00-5:00 Accepting Medicaid [facility] Family [facility] - [facility] [address] [place], [place], [place] [address] [phone] Mon-Fri 8:00-5:00 Accepting Medicaid  [place] [phone]) [facility] Primary Care at [facility] [address] [place], [place] [address] [phone] Mon-Fri 8:00-5:00 [facility] Family Medicine - [facility] Family Medicine at [facility]) [address], [place], [place] [address] [phone] Mon-Fri 8:00-5:00 Accepting Medicaid [facility] - [facility] ([facility] [employer] [facility]) [address], [place], [place] [address] [phone] Mon-Fri 8:00-5:30, Sat&Sun by appointment (phones open at 8:30) Accepting [facility]  [phone] & [phone]) [facility] Family Medicine [address] [place], [place] [address] [phone] Mon-Thur 8:00-7:00, Fri 8:00-5:00, Sat 8:00-12:00, Sun 9:00-12:00 Accepting Medicaid [facility] - Family Medicine at [facility] [address] [place], [place] [address] [phone] Mon-Thur 8:00-5:00 Accepting Medicaid [facility] - Family  Medicine at [place] [address] Sherian Maroon [place], [place] [address] [phone] Mon-Fri 8:00-5:30, Sat (Oct.-Mar.) 9:00-1:00 Accepting [facility] [place] [phone]) [facility] Family Medicine [address] [place], [place] [address] [phone] Mon-Fri 8:00-5:00 Accepting [facility]   [place] [phone]) [facility] Medicine at [facility] [address], [place], [place] [address] [phone] Mon-Fri 8:00-5:00 [place] [facility] at [facility] [address] Clint L[place]s [place], [place] [address] [phone] Mon-Fri 8:00-5:00 [facility] Pediatrics - [place] [address], [place], [place] [address] [phone] Mon-Fri 8:00-5:00 After hours clinic [facility] [place], [place] [address]) [phone] Mon-Fri 5:00-8:00, Sat 12:00-6:00, Sun 10:00-4:00 Accepting Medicaid [place] [facility] at [facility]. [address] [place], [place]  [address] [phone]   Fax - [phone]  [place] [phone]) Adult nurse HealthCare at [facility] [address] [name] [address] [place], [place], [place] [address] [phone] Mon-Fri 8:00-5:00 [facility] Family Medicine - [place] [facility] at [place]) [address] [name] [address], [place], [place] [address] [phone] Mon-Thur 8:00-7:00, Fri 8:00-5:00, Sat 8:00-12:00
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
## 2021-09-28 NOTE — Progress Notes (Signed)
[name], c/o numbness in hands, right lower back, shoulder and leg pain x 8/10 x 2 days.

## 2021-10-09 NOTE — L&D Delivery Note (Signed)
Delivery Note ?Pt progressed w/o augmentation to C/C/+1 w/urge to push.  After a 10 minute 2nd stage, at 11:56 AM a viable female was delivered via Vaginal, Spontaneous (Presentation: Left Occiput Anterior).  APGAR: 9, 9; weight pending. After 1 minute, the cord was clamped and cut. 40 units of pitocin diluted in 1000cc LR was infused rapidly IV.  The placenta separated spontaneously and delivered via CCT and maternal pushing effort.  It was inspected and appears to be intact with a 3 VC.  ? ?Anesthesia: Epidural ?Episiotomy: None ?Lacerations: 1st degree;Labial ?Suture Repair: 2.0 vicryl ?Est. Blood Loss (mL):   ? ?Mom to postpartum.  Baby to Couplet care / Skin to Skin. ? ?Christin Fudge ?12/08/2021, 12:18 PM ? ? ? ?

## 2021-10-12 ENCOUNTER — Other Ambulatory Visit: Payer: Self-pay

## 2021-10-12 ENCOUNTER — Ambulatory Visit: Payer: Medicaid Other | Admitting: *Deleted

## 2021-10-12 ENCOUNTER — Ambulatory Visit: Payer: Medicaid Other | Attending: Maternal & Fetal Medicine

## 2021-10-12 ENCOUNTER — Encounter: Payer: Self-pay | Admitting: *Deleted

## 2021-10-12 VITALS — BP 101/56 | HR 101

## 2021-10-12 DIAGNOSIS — R8271 Bacteriuria: Secondary | ICD-10-CM

## 2021-10-12 DIAGNOSIS — O283 Abnormal ultrasonic finding on antenatal screening of mother: Secondary | ICD-10-CM | POA: Insufficient documentation

## 2021-10-12 DIAGNOSIS — Z3A31 31 weeks gestation of pregnancy: Secondary | ICD-10-CM | POA: Diagnosis not present

## 2021-10-12 DIAGNOSIS — O358XX0 Maternal care for other (suspected) fetal abnormality and damage, not applicable or unspecified: Secondary | ICD-10-CM | POA: Diagnosis not present

## 2021-10-12 DIAGNOSIS — Z362 Encounter for other antenatal screening follow-up: Secondary | ICD-10-CM | POA: Diagnosis not present

## 2021-10-18 ENCOUNTER — Encounter: Payer: Self-pay | Admitting: Nurse Practitioner

## 2021-10-19 ENCOUNTER — Ambulatory Visit (INDEPENDENT_AMBULATORY_CARE_PROVIDER_SITE_OTHER): Payer: Medicaid Other | Admitting: Nurse Practitioner

## 2021-10-19 ENCOUNTER — Other Ambulatory Visit: Payer: Self-pay

## 2021-10-19 VITALS — BP 106/67 | HR 100 | Wt 136.0 lb

## 2021-10-19 DIAGNOSIS — Z3481 Encounter for supervision of other normal pregnancy, first trimester: Secondary | ICD-10-CM

## 2021-10-19 DIAGNOSIS — Z3A32 32 weeks gestation of pregnancy: Secondary | ICD-10-CM

## 2021-10-19 NOTE — Patient Instructions (Signed)
BRAINSTORMING  Develop a Plan Goals: Provide a way to start conversation about your new life with a baby Assist parents in recognizing and using resources within their reach Help pave the way before birth for an easier period of transition afterwards.  Make a list of the following information to keep in a central location: Full name of Mom and Partner: _____________________________________________ 43 full name and Date of Birth: ___________________________________________ Home Address: ___________________________________________________________ ________________________________________________________________________ Home Phone: ____________________________________________________________ Parents' cell numbers: _____________________________________________________ ________________________________________________________________________ Name and contact info for OB: ______________________________________________ Name and contact info for Pediatrician:________________________________________ Contact info for Lactation Consultants: ________________________________________  REST and SLEEP *You each need at least 4-5 hours of uninterrupted sleep every day. Write specific names and contact information.* How are you going to rest in the postpartum period? While partner's home? When partner returns to work? When you both return to work? Where will your baby sleep? Who is available to help during the day? Evening? Night? Who could move in for a period to help support you? What are some ideas to help you get enough sleep? __________________________________________________________________________________________________________________________________________________________________________________________________________________________________________ NUTRITIOUS FOOD AND DRINK *Plan for meals before your baby is born so you can have healthy food to eat during the immediate postpartum  period.* Who will look after breakfast? Lunch? Dinner? List names and contact information. Brainstorm quick, healthy ideas for each meal. What can you do before baby is born to prepare meals for the postpartum period? How can others help you with meals? Which grocery stores provide online shopping and delivery? Which restaurants offer take-out or delivery options? ______________________________________________________________________________________________________________________________________________________________________________________________________________________________________________________________________________________________________________________________________________________________________________________________________  CARE FOR MOM *It's important that mom is cared for and pampered in the postpartum period. Remember, the most important ways new mothers need care are: sleep, nutrition, gentle exercise, and time off.* Who can come take care of mom during this period? Make a list of people with their contact information. List some activities that make you feel cared for, rested, and energized? Who can make sure you have opportunities to do these things? Does mom have a space of her very own within your home that's just for her? Make a [facility] where she can be comfortable, rest, and renew herself daily. ______________________________________________________________________________________________________________________________________________________________________________________________________________________________________________________________________________________________________________________________________________________________________________________________________    CARE FOR AND FEEDING BABY *Knowledgeable and encouraging people will offer the best support with regard to feeding your baby.* Educate yourself and choose the best feeding option  for your baby. Make a list of people who will guide, support, and be a resource for you as your care for and feed your baby. (Friends that have breastfed or are currently breastfeeding, lactation consultants, breastfeeding support groups, etc.) Consider a postpartum doula. (These websites can give you information: dona.org & BuyingShow.es) Seek out local breastfeeding resources like the breastfeeding support group at [employer] or [employer]. ______________________________________________________________________________________________________________________________________________________________________________________________________________________________________________________________________________________________________________________________________________________________________________________________________  Verner Chol AND ERRANDS Who can help with a thorough cleaning before baby is born? Make a list of people who will help with housekeeping and chores, like laundry, light cleaning, dishes, bathrooms, etc. Who can run some errands for you? What can you do to make sure you are stocked with basic supplies before baby is born? Who is going to do the shopping? ______________________________________________________________________________________________________________________________________________________________________________________________________________________________________________________________________________________________________________________________________________________________________________________________________     Family Adjustment *Nurture yourselvesit helps parents be more loving and allows for better bonding with their child.* What sorts of things do you and partner enjoy doing together? Which activities help you to connect and strengthen your relationship? Make a list of those things. Make a list of people whom  trust to care for your baby so you  can have some time together as a couple. What types of things help partner feel connected to Mom? Make a list. What needs will partner have in order to bond with baby? Other children? Who will care for them when you go into labor and while you are in the hospital? Think about what the needs of your older children might be. Who can help you meet those needs? In what ways are you helping them prepare for bringing baby home? List some specific strategies you have for family adjustment. _______________________________________________________________________________________________________________________________________________________________________________________________________________________________________________________________________________________________________________________________________________  SUPPORT *Someone who can empathize with experiences normalizes your problems and makes them more bearable.* Make a list of other friends, neighbors, and/or co-workers you know with infants (and small children, if applicable) with whom you can connect. Make a list of local or online support groups, mom groups, etc. in which you can be involved. ______________________________________________________________________________________________________________________________________________________________________________________________________________________________________________________________________________________________________________________________________________________________________________________________________  Childcare Plans Investigate and plan for childcare if mom is returning to work. Talk about mom's concerns about her transition back to work. Talk about partner's concerns regarding this transition.  Mental Health *Your mental health is one of the highest priorities for a pregnant or postpartum mom.* 1 in 5 women experience anxiety and/or depression from the time  of conception through the first year after birth. Postpartum Mood Disorders are the #1 complication of pregnancy and childbirth and the suffering experienced by these mothers is not necessary! These illnesses are temporary and respond well to treatment, which often includes self-care, social support, talk therapy, and medication when needed. Women experiencing anxiety and depression often say things like: I'm supposed to be happywhy do I feel so sad?, Why can't I snap out of it?, I'm having thoughts that scare me. There is no need to be embarrassed if you are feeling these symptoms: Overwhelmed, anxious, angry, sad, guilty, irritable, hopeless, exhausted but can't sleep You are NOT alone. You are NOT to blame. With help, you WILL be well. Where can I find help? Medical professionals such as your OB, midwife, gynecologist, family practitioner, primary care provider, pediatrician, or mental health providers; [facility] support groups: Feelings After Birth, Breastfeeding Support Group, Baby and Me Group, and Fit 4 Two exercise classes. You have permission to ask for help. It will confirm your feelings, validate your experiences, share/learn coping strategies, and gain support and encouragement as you heal. You are important! BRAINSTORM Make a list of local resources, including resources for mom and for partner. Identify support groups. Identify people to call late at night - include names and contact info. Talk with partner about perinatal mood and anxiety disorders. Talk with your OB, midwife, and doula about baby blues and about perinatal mood and anxiety disorders. Talk with your pediatrician about perinatal mood and anxiety disorders.   Support & Sanity Savers   What do you really need?  Basics In preparing for a new baby, many expectant parents spend hours shopping for baby clothes, decorating the nursery, and deciding which car seat to buy. Yet most don't think much about what  the reality of parenting a newborn will be like, and what they need to make it through that. So, here is the advice of experienced parents. We know you'll read this, and think they're exaggerating, I don't really need that. Just trust [name] on these, OK? Plan for all of this, and if it turns out you don't need it, come back and teach [name] how you did it!  Must-Haves (Once baby's survival  needs are met, make sure you attend to your own survival needs!) Sleep An average newborn sleeps 16-18 hours per day, over 6-7 sleep periods, rarely more than three hours at a time. It is normal and healthy for a newborn to wake throughout the night... but really hard on parents!! Naps. Prioritize sleep above any responsibilities like: cleaning house, visiting friends, running errands, etc.  Sleep whenever baby sleeps. If you can't nap, at least have restful times when baby eats. The more rest you get, the more patient you will be, the more emotionally stable, and better at solving problems.  Food You may not have realized it would be difficult to eat when you have a newborn. Yet, when we talk to countless new parents, they say things like it may be 2:00 pm when I realize I haven't had breakfast yet. Or every time we sit down to dinner, baby needs to eat, and my food gets cold, so I don't bother to eat it. Finger food. Before your baby is born, stock up with one months' worth of food that: 1) you can eat with one hand while holding a baby, 2) doesn't need to be prepped, 3) is good hot or cold, 4) doesn't spoil when left out for a few hours, and 5) you like to eat. Think about: nuts, dried fruit, Clif bars, pretzels, jerky, gogurt, baby carrots, apples, bananas, crackers, cheez-n-crackers, string cheese, hot pockets or frozen burritos to microwave, garden burgers and breakfast pastries to put in the toaster, yogurt drinks, etc. Restaurant Menus. Make lists of your favorite restaurants & menu items. When family/friends  want to help, you can give specific information without much thought. They can either bring you the food or send gift cards for just the right meals. Freezer Meals.  Take some time to make a few meals to put in the freezer ahead of time.  Easy to freeze meals can be anything such as soup, lasagna, chicken pie, or spaghetti sauce. Set up a Meal Schedule.  Ask friends and family to sign up to bring you meals during the first few weeks of being home. (It can be passed around at baby showers!) You have no idea how helpful this will be until you are in the throes of parenting.  https://hamilton-woodard.com/ is a great website to check out. Emotional Support Know who to call when you're stressed out. Parenting a newborn is very challenging work. There are times when it totally overwhelms your normal coping abilities. EVERY NEW PARENT NEEDS TO HAVE A PLAN FOR WHO TO CALL WHEN THEY JUST CAN'T COPE ANY MORE. (And it has to be someone other than the baby's other parent!) Before your baby is born, come up with at least one person you can call for support - write their phone number down and post it on the refrigerator. Anxiety & Sadness. Baby blues are normal after pregnancy; however, there are more severe types of anxiety & sadness which can occur and should not be ignored.  They are always treatable, but you have to take the first step by reaching out for help. [facility] offers a Mom Talk group which meets every Tuesday from 10 am - 11 am.  This group is for new moms who need support and connection after their babies are born.  Call [phone].  Really, Really Helpful (Plan for them! Make sure these happen often!!) Physical Support with Taking Care of Yourselves Asking friends and family. Before your baby is born, set up a schedule  schedule of people who can come and visit and help out (or ask a friend to schedule for you). Any time someone says let me know what I can do to help, sign them up for a day. When they get  there, their job is not to take care of the baby (that's your job and your joy). Their job is to take care of you!  Postpartum doulas. If you don't have anyone you can call on for support, look into postpartum doulas:  professionals at helping parents with caring for baby, caring for themselves, getting breastfeeding started, and helping with household tasks. www.padanc.org is a helpful website for learning about doulas in our area. Peer Support / Parent Groups Why: One of the greatest ideas for new parents is to be around other new parents. Parent groups give you a chance to share and listen to others who are going through the same season of life, get a sense of what is normal infant development by watching several babies learn and grow, share your stories of triumph and struggles with empathetic ears, and forgive your own mistakes when you realize all parents are learning by trial and error. Where to find: There are many places you can meet other new parents throughout our community.  [facility] offers the following classes for new moms and their little ones:  Baby and Me (Birth to [place]) and Breastfeeding Support Group. Go to www.conehealthybaby.com or call [phone] for more information. Time for your Relationship It's easy to get so caught up in meeting baby's immediate needs that it's hard to find time to connect with your partner, and meet the needs of your relationship. It's also easy to forget what quality time with your partner actually looks like. If you take your baby on a date, you'd be amazed how much of your couple time is spent feeding the baby, diapering the baby, admiring the baby, and talking about the baby. Dating: Try to take time for just the two of you. Babysitter tip: Sometimes when moms are breastfeeding a newborn, they find it hard to figure out how to schedule outings around baby's unpredictable feeding schedules. Have the babysitter come for a three hour period. When  she comes over, if baby has just eaten, you can leave right away, and come back in two hours. If baby hasn't fed recently, you start the date at home. Once baby gets hungry and gets a good feeding in, you can head out for the rest of your date time. Date Nights at Home: If you can't get out, at least set aside one evening a week to prioritize your relationship: whenever baby dozes off or doesn't have any immediate needs, spend a little time focusing on each other. Potential conflicts: The main relationship conflicts that come up for new parents are: issues related to sexuality, financial stresses, a feeling of an unfair division of household tasks, and conflicts in parenting styles. The more you can work on these issues before baby arrives, the better!  Fun and Frills (Don't forget these and don't feel guilty for indulging in them!) Everyone has something in life that is a fun little treat that they do just for themselves. It may be: reading the morning paper, or going for a daily jog, or having coffee with a friend once a week, or going to a movie on Friday nights, or fine chocolates, or bubble baths, or curling up with a good book. Unless you do fun things for yourself every now and  then, it's hard to have the energy for fun with your baby. Whatever your special treats are, make sure you find a way to continue to indulge in them after your baby is born. These special moments can recharge you, and allow you to return to baby with a new joy   PERINATAL MOOD DISORDERS: [place]   _________________________________________Emergency and Crisis Resources If you are an imminent risk to self or others, are experiencing intense personal distress, and/or have noticed significant changes in activities of daily living, call:  [place]: [phone]  [address], [place], [place], [address] Mobile Crisis:  [place]: 988 Or visit the following crisis centers: Local Emergency Departments [facility]: [address] [place]. Hours: 8:30AM-5PM. Insurance Accepted: Medicaid, Medicare, and Uninsured.  [facility]:  [address] [place]  Mon-Friday 8am-3pm, [phone]                                                                                  ___________ Non-Crisis Resources To identify specific providers that are covered by your insurance, contact your insurance company or local agencies:  [place]: [phone] [facility] and [employer]: [place]: [phone] Postpartum Support International- Warm-line: 2703314760                                                      __Outpatient Therapy and Medication Management   Providers:  [facility]: [phone] Hours: 9AM-5PM  Insurance Accepted: Alben Spittle, Shane Crutch, [place], [place] [facility]): [phone] Hours: 8AM-5:30PM  nsurance Accepted: All insurances EXCEPT AARP, [place], [place], and [place]: [phone] Hours: 8AM-8PM Insurance Accepted: Cristal Ford, Freddrick March, [place], Medicare, [phone] [facility]: [phone] Hours: 8:30AM-7PM Insurance Accepted: Cristal Ford, Medicaid, Medicare, Tricare, [employer] Counseling:  [place] Accepted:  Holland Falling, Lorella Nimrod, [employer], [place]: [phone] Hours: 9AM-5:30PM Insurance Accepted: Alben Spittle, Charlotte Crumb, and Medicaid, Medicare, [facility]:  [phone] Hours: 9am-5pm Insurance Accepted: BCBS; they do not accept Medicaid/Medicare [facility]: [phone] Hours: 9am-9pm Insurance Accepted: All major insurance including Medicaid and Medicare [facility]: [phone] Hours: [place] Accepted: All insurances EXCEPT Medicaid and Medicare. [facility]: [phone]   ____________                                                                     Parenting [place]: [phone] [place]:  [place]: (support for children in the  NICU and/or with special needs), [phone]   ___________                                                                 Mental Health Support Groups Mental Health Association: [phone]    _____________                                                                                  Online Resources Postpartum Support International: http://jones-berg.com/  202-542-7CWC 2Moms Supporting Moms:  www.momssupportingmoms.net

## 2021-10-19 NOTE — Progress Notes (Signed)
° ° °  Subjective:  Jacqueline Mitchell is a 26 y.o. G2P1001 at [redacted]w[redacted]d being seen today for ongoing prenatal care.  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  Patient reports no complaints.  Contractions: Irritability. Vag. Bleeding: None.  Movement: Present. Denies leaking of fluid.   The following portions of the patient's history were reviewed and updated as appropriate: allergies, current medications, past family history, past medical history, past social history, past surgical history and problem list. Problem list updated.  Objective:   Vitals:   10/19/21 1529  BP: 106/67  Pulse: 100  Weight: 136 lb (61.7 kg)    Fetal Status: Fetal Heart Rate (bpm): 145   Movement: Present     General:  Alert, oriented and cooperative. Patient is in no acute distress.  Skin: Skin is warm and dry. No rash noted.   Cardiovascular: Normal heart rate noted  Respiratory: Normal respiratory effort, no problems with respiration noted  Abdomen: Soft, gravid, appropriate for gestational age. Pain/Pressure: Absent     Pelvic:  Cervical exam deferred        Extremities: Normal range of motion.     Mental Status: Normal mood and affect. Normal behavior. Normal judgment and thought content.   Urinalysis:      Assessment and Plan:  Pregnancy: G2P1001 at [redacted]w[redacted]d  1. Encounter for supervision of other normal pregnancy in first trimester Baby is moving well. Feeling Braxton Hicks contractions but not as much as 4 per hour. Encouraged lots of fluids.  2. [redacted] weeks gestation of pregnancy   Preterm labor symptoms and general obstetric precautions including but not limited to vaginal bleeding, contractions, leaking of fluid and fetal movement were reviewed in detail with the  patient. Please refer to After Visit Summary for other counseling recommendations.  Return in about 2 weeks (around 11/02/2021) for in person [name].  Nolene Bernheim, RN, MSN, NP-BC Nurse Practitioner, [facility] for [employer], [facility] 10/19/2021 3:41 PM

## 2021-11-02 ENCOUNTER — Encounter: Payer: Self-pay | Admitting: Obstetrics

## 2021-11-02 ENCOUNTER — Other Ambulatory Visit: Payer: Self-pay

## 2021-11-02 ENCOUNTER — Ambulatory Visit (INDEPENDENT_AMBULATORY_CARE_PROVIDER_SITE_OTHER): Payer: Medicaid Other | Admitting: Obstetrics

## 2021-11-02 VITALS — BP 98/62 | HR 95 | Wt 138.0 lb

## 2021-11-02 DIAGNOSIS — Z3480 Encounter for supervision of other normal pregnancy, unspecified trimester: Secondary | ICD-10-CM

## 2021-11-02 NOTE — Progress Notes (Signed)
Subjective:  Jacqueline Mitchell is a 26 y.o. G2P1001 at [redacted]w[redacted]d being seen today for ongoing prenatal care.  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  Patient reports no complaints.  Contractions: Irritability. Vag. Bleeding: None.  Movement: Present. Denies leaking of fluid.   The following portions of the patient's history were reviewed and updated as appropriate: allergies, current medications, past family history, past medical history, past social history, past surgical history and problem list. Problem list updated.  Objective:   Vitals:   11/02/21 1602  BP: 98/62  Pulse: 95  Weight: 138 lb (62.6 kg)    Fetal Status: Fetal Heart Rate (bpm): 143   Movement: Present     General:  Alert, oriented and cooperative. Patient is in no acute distress.  Skin: Skin is warm and dry. No rash noted.   Cardiovascular: Normal heart rate noted  Respiratory: Normal respiratory effort, no problems with respiration noted  Abdomen: Soft, gravid, appropriate for gestational age. Pain/Pressure: Absent     Pelvic:  Cervical exam deferred        Extremities: Normal range of motion.  Edema: None  Mental Status: Normal mood and affect. Normal behavior. Normal judgment and thought content.   Urinalysis:      Assessment and Plan:  Pregnancy: G2P1001 at [redacted]w[redacted]d  1. Supervision of other normal pregnancy, antepartum   Preterm labor symptoms and general obstetric precautions including but not limited to vaginal bleeding, contractions, leaking of fluid and fetal movement were reviewed in detail with the patient. Please refer to After Visit Summary for other counseling recommendations.   Return in about 2 weeks (around 11/16/2021) for [name].   Brock Bad, MD  11/02/21

## 2021-11-16 ENCOUNTER — Other Ambulatory Visit: Payer: Self-pay

## 2021-11-16 ENCOUNTER — Other Ambulatory Visit (HOSPITAL_COMMUNITY)
Admission: RE | Admit: 2021-11-16 | Discharge: 2021-11-16 | Disposition: A | Discharge status: Home / Self Care | Payer: Medicaid Other | Source: Ambulatory Visit | Attending: Nurse Practitioner | Admitting: Nurse Practitioner

## 2021-11-16 ENCOUNTER — Ambulatory Visit (INDEPENDENT_AMBULATORY_CARE_PROVIDER_SITE_OTHER): Payer: Medicaid Other | Admitting: Nurse Practitioner

## 2021-11-16 ENCOUNTER — Encounter: Payer: Self-pay | Admitting: Nurse Practitioner

## 2021-11-16 VITALS — BP 104/68 | HR 101 | Wt 138.6 lb

## 2021-11-16 DIAGNOSIS — Z3480 Encounter for supervision of other normal pregnancy, unspecified trimester: Secondary | ICD-10-CM | POA: Diagnosis not present

## 2021-11-16 DIAGNOSIS — Z3A36 36 weeks gestation of pregnancy: Secondary | ICD-10-CM

## 2021-11-16 DIAGNOSIS — R8271 Bacteriuria: Secondary | ICD-10-CM

## 2021-11-16 NOTE — Progress Notes (Signed)
° ° °  Subjective:  Jacqueline Mitchell is a 26 y.o. G2P1001 at [redacted]w[redacted]d being seen today for ongoing prenatal care.  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  Patient reports no complaints.  Contractions: Not present. Vag. Bleeding: None.  Movement: Present. Denies leaking of fluid.   The following portions of the patient's history were reviewed and updated as appropriate: allergies, current medications, past family history, past medical history, past social history, past surgical history and problem list. Problem list updated.  Objective:   Vitals:   11/16/21 1615  BP: 104/68  Pulse: (!) 101  Weight: 138 lb 9.6 oz (62.9 kg)    Fetal Status: Fetal Heart Rate (bpm): 155 Fundal Height: 35 cm Movement: Present     General:  Alert, oriented and cooperative. Patient is in no acute distress.  Skin: Skin is warm and dry. No rash noted.   Cardiovascular: Normal heart rate noted  Respiratory: Normal respiratory effort, no problems with respiration noted  Abdomen: Soft, gravid, appropriate for gestational age. Pain/Pressure: Absent     Pelvic:  Cervical exam deferred        Extremities: Normal range of motion.  Edema: None  Mental Status: Normal mood and affect. Normal behavior. Normal judgment and thought content.   Urinalysis:      Assessment and Plan:  Pregnancy: G2P1001 at [redacted]w[redacted]d  1. Supervision of other normal pregnancy, antepartum Doing well, baby moving well  2. GBS bacteriuria Will treat GBS in labor  - no test done today, NKA  3. [redacted] weeks gestation of pregnancy   Preterm labor symptoms and general obstetric precautions including but not limited to vaginal bleeding, contractions, leaking of fluid and fetal movement were reviewed in detail with the  patient. Please refer to After Visit Summary for other counseling recommendations.  Return in about 1 week (around 11/23/2021) for needs weekly visits x 4 - [name].  Nolene Bernheim, RN, MSN, NP-BC Nurse Practitioner, [facility] for [employer], [facility] 11/16/2021 4:29 PM

## 2021-11-16 NOTE — Progress Notes (Signed)
Patient presents for [name]. Patient has no concerns today. 

## 2021-11-17 LAB — CERVICOVAGINAL ANCILLARY ONLY
Chlamydia: NEGATIVE
Comment: NEGATIVE
Comment: NEGATIVE
Comment: NORMAL
Neisseria Gonorrhea: NEGATIVE
Trichomonas: NEGATIVE

## 2021-11-23 ENCOUNTER — Ambulatory Visit (INDEPENDENT_AMBULATORY_CARE_PROVIDER_SITE_OTHER): Payer: Medicaid Other | Admitting: Obstetrics

## 2021-11-23 ENCOUNTER — Other Ambulatory Visit: Payer: Self-pay

## 2021-11-23 ENCOUNTER — Encounter: Payer: Self-pay | Admitting: Obstetrics

## 2021-11-23 VITALS — BP 102/67 | HR 99 | Wt 137.3 lb

## 2021-11-23 DIAGNOSIS — Z3480 Encounter for supervision of other normal pregnancy, unspecified trimester: Secondary | ICD-10-CM

## 2021-11-23 DIAGNOSIS — R8271 Bacteriuria: Secondary | ICD-10-CM

## 2021-11-23 NOTE — Progress Notes (Signed)
Subjective:  Jacqueline Mitchell is a 26 y.o. G2P1001 at [redacted]w[redacted]d being seen today for ongoing prenatal care.  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  Patient reports no complaints.  Contractions: Not present. Vag. Bleeding: None.  Movement: Present. Denies leaking of fluid.   The following portions of the patient's history were reviewed and updated as appropriate: allergies, current medications, past family history, past medical history, past social history, past surgical history and problem list. Problem list updated.  Objective:   Vitals:   11/23/21 1134  BP: 102/67  Pulse: 99  Weight: 137 lb 4.8 oz (62.3 kg)    Fetal Status:     Movement: Present     General:  Alert, oriented and cooperative. Patient is in no acute distress.  Skin: Skin is warm and dry. No rash noted.   Cardiovascular: Normal heart rate noted  Respiratory: Normal respiratory effort, no problems with respiration noted  Abdomen: Soft, gravid, appropriate for gestational age. Pain/Pressure: Present     Pelvic:  Cervical exam deferred        Extremities: Normal range of motion.  Edema: None  Mental Status: Normal mood and affect. Normal behavior. Normal judgment and thought content.   Urinalysis:      Assessment and Plan:  Pregnancy: G2P1001 at [redacted]w[redacted]d  1. Supervision of other normal pregnancy, antepartum   2. GBS bacteriuria - treat in labor  Preterm labor symptoms and general obstetric precautions including but not limited to vaginal bleeding, contractions, leaking of fluid and fetal movement were reviewed in detail with the patient. Please refer to After Visit Summary for other counseling recommendations.   Return in about 1 week (around 11/30/2021) for [name].   Shelly Bombard, MD   11/23/21

## 2021-11-23 NOTE — Progress Notes (Signed)
Pt reports fetal movement with some pressure. 

## 2021-11-28 ENCOUNTER — Inpatient Hospital Stay (HOSPITAL_COMMUNITY)
Admission: AD | Admit: 2021-11-28 | Discharge: 2021-11-28 | Disposition: A | Discharge status: Home / Self Care | Payer: Medicaid Other | Attending: Obstetrics & Gynecology | Admitting: Obstetrics & Gynecology

## 2021-11-28 ENCOUNTER — Encounter (HOSPITAL_COMMUNITY): Payer: Self-pay | Admitting: Obstetrics & Gynecology

## 2021-11-28 ENCOUNTER — Other Ambulatory Visit: Payer: Self-pay

## 2021-11-28 DIAGNOSIS — O471 False labor at or after 37 completed weeks of gestation: Secondary | ICD-10-CM | POA: Insufficient documentation

## 2021-11-28 DIAGNOSIS — E876 Hypokalemia: Secondary | ICD-10-CM | POA: Diagnosis not present

## 2021-11-28 DIAGNOSIS — R519 Headache, unspecified: Secondary | ICD-10-CM

## 2021-11-28 DIAGNOSIS — R8271 Bacteriuria: Secondary | ICD-10-CM

## 2021-11-28 DIAGNOSIS — O479 False labor, unspecified: Secondary | ICD-10-CM

## 2021-11-28 DIAGNOSIS — O212 Late vomiting of pregnancy: Secondary | ICD-10-CM | POA: Diagnosis not present

## 2021-11-28 DIAGNOSIS — Z3A38 38 weeks gestation of pregnancy: Secondary | ICD-10-CM | POA: Insufficient documentation

## 2021-11-28 DIAGNOSIS — Z3480 Encounter for supervision of other normal pregnancy, unspecified trimester: Secondary | ICD-10-CM

## 2021-11-28 LAB — CBC WITH DIFFERENTIAL/PLATELET
Abs Immature Granulocytes: 0.16 10*3/uL — ABNORMAL HIGH (ref 0.00–0.07)
Basophils Absolute: 0 10*3/uL (ref 0.0–0.1)
Basophils Relative: 0 %
Eosinophils Absolute: 0.1 10*3/uL (ref 0.0–0.5)
Eosinophils Relative: 2 %
HCT: 30.9 % — ABNORMAL LOW (ref 36.0–46.0)
Hemoglobin: 9.9 g/dL — ABNORMAL LOW (ref 12.0–15.0)
Immature Granulocytes: 2 %
Lymphocytes Relative: 16 %
Lymphs Abs: 1.4 10*3/uL (ref 0.7–4.0)
MCH: 26.1 pg (ref 26.0–34.0)
MCHC: 32 g/dL (ref 30.0–36.0)
MCV: 81.5 fL (ref 80.0–100.0)
Monocytes Absolute: 0.6 10*3/uL (ref 0.1–1.0)
Monocytes Relative: 7 %
Neutro Abs: 6.3 10*3/uL (ref 1.7–7.7)
Neutrophils Relative %: 73 %
Platelets: 309 10*3/uL (ref 150–400)
RBC: 3.79 MIL/uL — ABNORMAL LOW (ref 3.87–5.11)
RDW: 13.8 % (ref 11.5–15.5)
WBC: 8.6 10*3/uL (ref 4.0–10.5)
nRBC: 0.2 % (ref 0.0–0.2)

## 2021-11-28 LAB — URINALYSIS, ROUTINE W REFLEX MICROSCOPIC
Bilirubin Urine: NEGATIVE
Glucose, UA: NEGATIVE mg/dL
Hgb urine dipstick: NEGATIVE
Ketones, ur: NEGATIVE mg/dL
Nitrite: NEGATIVE
Protein, ur: NEGATIVE mg/dL
Specific Gravity, Urine: 1.017 (ref 1.005–1.030)
pH: 7 (ref 5.0–8.0)

## 2021-11-28 LAB — COMPREHENSIVE METABOLIC PANEL
ALT: 7 U/L (ref 0–44)
AST: 19 U/L (ref 15–41)
Albumin: 2.7 g/dL — ABNORMAL LOW (ref 3.5–5.0)
Alkaline Phosphatase: 161 U/L — ABNORMAL HIGH (ref 38–126)
Anion gap: 9 (ref 5–15)
BUN: <5 mg/dL — ABNORMAL LOW (ref 6–20)
CO2: 24 mmol/L (ref 22–32)
Calcium: 8.3 mg/dL — ABNORMAL LOW (ref 8.9–10.3)
Chloride: 100 mmol/L (ref 98–111)
Creatinine, Ser: 0.63 mg/dL (ref 0.44–1.00)
GFR, Estimated: >60 mL/min (ref >60)
Glucose, Bld: 138 mg/dL — ABNORMAL HIGH (ref 70–99)
Potassium: 3 mmol/L — ABNORMAL LOW (ref 3.5–5.1)
Sodium: 133 mmol/L — ABNORMAL LOW (ref 135–145)
Total Bilirubin: 0.3 mg/dL (ref 0.3–1.2)
Total Protein: 6.4 g/dL — ABNORMAL LOW (ref 6.5–8.1)

## 2021-11-28 LAB — PROTEIN / CREATININE RATIO, URINE
Creatinine, Urine: 160.2 mg/dL
Protein Creatinine Ratio: 0.11 mg/mg{Cre} (ref 0.00–0.15)
Total Protein, Urine: 18 mg/dL

## 2021-11-28 MED ORDER — ACETAMINOPHEN 325 MG PO TABS: 1000.0000 mg | ORAL_TABLET | Freq: Four times a day (QID) | ORAL | 0 refills | Status: DC | PRN

## 2021-11-28 MED ORDER — ACETAMINOPHEN 500 MG PO TABS
1000.0000 mg | ORAL_TABLET | Freq: Once | ORAL | Status: AC
  Administered 2021-11-28: 1000 mg via ORAL
  Filled 2021-11-28: qty 2

## 2021-11-28 MED ORDER — POTASSIUM & SODIUM PHOSPHATES 280-160-250 MG PO PACK
1.0000 | PACK | Freq: Three times a day (TID) | ORAL | Status: DC
  Administered 2021-11-28: 1 via ORAL
  Filled 2021-11-28 (×2): qty 1

## 2021-11-28 MED ORDER — POTASSIUM & SODIUM PHOSPHATES 280-160-250 MG PO PACK: 1.0000 | PACK | Freq: Three times a day (TID) | ORAL | 0 refills | Status: AC

## 2021-11-28 NOTE — Discharge Instructions (Addendum)
You came to the MAU because you had headaches and contractions and nausea. We treated your headache which got better with Tylenol. We did labs for your headache since you also had some vision symptoms earlier and you did not have any protein in your urine and your liver function was normal so we did not think you had pre-eclampsia. Your potassium was low so we gave you one dose of treatment and also sent some potassium to your pharmacy.   We checked your cervix and you were 1.5cm and stayed the same.   Please seek medical care if you have significant vaginal bleeding, leaking of fluid that's soaking your pad or underwear, contractions that are less than five minutes apart and hard to breathe or talk through, or do not feel your baby move.

## 2021-11-28 NOTE — MAU Note (Addendum)
Started contracting yesterday, coming "real often'.hurting in the front and the back. No recent exams.  No bleeding or ROM.  Also having nausea and vomiting along with the pains. Headache started yesterday, did not take anything.  Has blurring and spots when she gets dizzy,sometimes happens when she is walking. Denies swelling or epigastric pain.

## 2021-11-28 NOTE — MAU Provider Note (Signed)
History     CSN: [id number]  Arrival date and time: 11/28/21 1710   None     Chief Complaint  Patient presents with   Nausea   Emesis   Contractions   HPI 26 yo F G2P1001 at 39w1dwho presents for contractions. Initially patient was an RTherapist, sportslabor check but due to headache and vision symptoms earlier she was seen by provider.  Headache Side of head Had some blurry vision earlier, not currently Nausea No prior history of headache HA 7/10 improved from 10/10 Drank 6 bottles of water today   Contractions Started yesterday  Every 5-30 min Currently feels every 10-15 minutes Doesn't feel LOF  Nausea Has intermittent nausea and vomiting Has not taken any meds today  OB History     Gravida  2   Para  1   Term  1   Preterm      AB      Living  1      SAB      IAB      Ectopic      Multiple  0   Live Births  1           Past Medical History:  Diagnosis Date   Anemia    Medical history non-contributory     Past Surgical History:  Procedure Laterality Date   NO PAST SURGERIES      Family History  Problem Relation Age of Onset   Diabetes Mother    Miscarriages / SKoreaMother    Healthy Father     Social History   Tobacco Use   Smoking status: Never   Smokeless tobacco: Current    Types: Chew  Vaping Use   Vaping Use: Never used  Substance Use Topics   Alcohol use: Not Currently    Comment: Occasional   Drug use: Never    Allergies: No Known Allergies  Medications Prior to Admission  Medication Sig Dispense Refill Last Dose   Iron Polysacch Cmplx-B12-FA 150-0.025-1 MG CAPS Take 1 capsule by mouth every other day. 30 capsule 5 11/28/2021   Prenatal Vit-Fe Phos-FA-Omega (VITAFOL GUMMIES) 3.33-0.333-34.8 MG CHEW Chew 3 tablets by mouth daily. 90 tablet 3 11/28/2021   Blood Pressure Monitoring (BLOOD PRESSURE KIT) DEVI 1 kit by Does not apply route once a week. 1 each 0     Review of Systems  Constitutional:  Negative for  fever.  Gastrointestinal:  Positive for abdominal pain and nausea. Negative for constipation, diarrhea and vomiting.  Endocrine: Negative for polyuria.  Genitourinary:  Negative for dysuria.  Musculoskeletal:  Negative for back pain.  Skin:  Negative for rash.  Neurological:  Positive for headaches. Negative for syncope, speech difficulty, weakness, light-headedness and numbness.  All other systems reviewed and are negative. Physical Exam   Blood pressure 105/66, pulse 100, temperature 98.3 F (36.8 C), temperature source Oral, resp. rate 16, height 5' 2"  (1.575 m), weight 64.2 kg, last menstrual period 01/24/2021, SpO2 100 %.  Physical Exam Vitals and nursing note reviewed. Exam conducted with a chaperone present.  Constitutional:      Appearance: Normal appearance.  HENT:     Head: Normocephalic.     Nose: Nose normal.  Cardiovascular:     Rate and Rhythm: Normal rate.     Pulses: Normal pulses.  Pulmonary:     Effort: Pulmonary effort is normal.  Abdominal:     Tenderness: There is no guarding.     Comments: Gravid, no TTP  Skin:    General: Skin is warm.     Capillary Refill: Capillary refill takes less than 2 seconds.  Neurological:     General: No focal deficit present.     Mental Status: She is alert and oriented to person, place, and time.  Psychiatric:        Mood and Affect: Mood normal.    MAU Course  Procedures  Reactive NST FHR 140s, accels, no decels Contractions initially every 1-2 min,then spaced to every 5, then further spaced out  MDM moderate  Assessment and Plan    Contractions False labor. Patient with some sporadic contractions that spaced out while in MAU. Cervix unchanged on two checks at 1.5 cm. Denies urinary symptoms and UA with leuks but no nitrates therefore will not treat.   2. Headache and intermittent vision changes Patient presented with headache and blurry vision earlier in the day (headache present when in MAU but blurry  vision had resolved). Had completely normotensive blood pressure. Will get preE labs given neurological symptoms. PreE labs normal. Treated with tylenol for headache which improved to 2/10  3. Hypokalemia Patient with hypokalemia in the setting of nausea and vomiting. Given Phos-NaK here and also sent 5 more doses for home  4. Nausea Currently mild nausea with no vomiting. Offered Zofran but patient deferred.  Renard Matter, MD, MPH OB Fellow, Faculty Practice

## 2021-11-30 ENCOUNTER — Encounter: Payer: Medicaid Other | Admitting: Obstetrics

## 2021-12-08 ENCOUNTER — Encounter: Payer: Medicaid Other | Admitting: Obstetrics and Gynecology

## 2021-12-08 ENCOUNTER — Other Ambulatory Visit: Payer: Self-pay

## 2021-12-08 ENCOUNTER — Inpatient Hospital Stay (HOSPITAL_COMMUNITY)
Admission: AD | Admit: 2021-12-08 | Discharge: 2021-12-10 | DRG: 807 | Disposition: A | Discharge status: Home / Self Care | Payer: Medicaid Other | Attending: Obstetrics & Gynecology | Admitting: Obstetrics & Gynecology

## 2021-12-08 ENCOUNTER — Inpatient Hospital Stay (HOSPITAL_COMMUNITY): Payer: Medicaid Other | Admitting: Anesthesiology

## 2021-12-08 ENCOUNTER — Encounter (HOSPITAL_COMMUNITY): Payer: Self-pay | Admitting: Obstetrics & Gynecology

## 2021-12-08 DIAGNOSIS — O99824 Streptococcus B carrier state complicating childbirth: Secondary | ICD-10-CM | POA: Diagnosis present

## 2021-12-08 DIAGNOSIS — Z20822 Contact with and (suspected) exposure to covid-19: Secondary | ICD-10-CM | POA: Diagnosis present

## 2021-12-08 DIAGNOSIS — O26893 Other specified pregnancy related conditions, third trimester: Secondary | ICD-10-CM | POA: Diagnosis present

## 2021-12-08 DIAGNOSIS — Z3A39 39 weeks gestation of pregnancy: Secondary | ICD-10-CM

## 2021-12-08 DIAGNOSIS — Z3480 Encounter for supervision of other normal pregnancy, unspecified trimester: Secondary | ICD-10-CM

## 2021-12-08 DIAGNOSIS — R8271 Bacteriuria: Secondary | ICD-10-CM | POA: Diagnosis present

## 2021-12-08 DIAGNOSIS — O4202 Full-term premature rupture of membranes, onset of labor within 24 hours of rupture: Secondary | ICD-10-CM | POA: Diagnosis not present

## 2021-12-08 DIAGNOSIS — O99820 Streptococcus B carrier state complicating pregnancy: Secondary | ICD-10-CM | POA: Diagnosis not present

## 2021-12-08 LAB — CBC
HCT: 31.7 % — ABNORMAL LOW (ref 36.0–46.0)
Hemoglobin: 10.1 g/dL — ABNORMAL LOW (ref 12.0–15.0)
MCH: 25.9 pg — ABNORMAL LOW (ref 26.0–34.0)
MCHC: 31.9 g/dL (ref 30.0–36.0)
MCV: 81.3 fL (ref 80.0–100.0)
Platelets: 348 10*3/uL (ref 150–400)
RBC: 3.9 MIL/uL (ref 3.87–5.11)
RDW: 14.4 % (ref 11.5–15.5)
WBC: 10.2 10*3/uL (ref 4.0–10.5)
nRBC: 0.5 % — ABNORMAL HIGH (ref 0.0–0.2)

## 2021-12-08 LAB — RESP PANEL BY RT-PCR (FLU A&B, COVID) ARPGX2
Influenza A by PCR: NEGATIVE
Influenza B by PCR: NEGATIVE
SARS Coronavirus 2 by RT PCR: NEGATIVE

## 2021-12-08 LAB — POCT FERN TEST: POCT Fern Test: POSITIVE

## 2021-12-08 LAB — TYPE AND SCREEN
ABO/RH(D): B POS
Antibody Screen: NEGATIVE

## 2021-12-08 LAB — RPR: RPR Ser Ql: NONREACTIVE

## 2021-12-08 MED ORDER — FERROUS SULFATE 325 (65 FE) MG PO TABS
325.0000 mg | ORAL_TABLET | ORAL | Status: DC
  Administered 2021-12-08 – 2021-12-10 (×2): 325 mg via ORAL
  Filled 2021-12-08 (×2): qty 1

## 2021-12-08 MED ORDER — MEASLES, MUMPS & RUBELLA VAC IJ SOLR: 0.5000 mL | Freq: Once | INTRAMUSCULAR | Status: DC

## 2021-12-08 MED ORDER — IBUPROFEN 600 MG PO TABS
600.0000 mg | ORAL_TABLET | Freq: Four times a day (QID) | ORAL | Status: DC
  Administered 2021-12-08 – 2021-12-10 (×8): 600 mg via ORAL
  Filled 2021-12-08 (×8): qty 1

## 2021-12-08 MED ORDER — ONDANSETRON HCL 4 MG PO TABS: 4.0000 mg | ORAL_TABLET | ORAL | Status: DC | PRN

## 2021-12-08 MED ORDER — FLEET ENEMA 7-19 GM/118ML RE ENEM: 1.0000 | ENEMA | Freq: Every day | RECTAL | Status: DC | PRN

## 2021-12-08 MED ORDER — PENICILLIN G POT IN DEXTROSE 60000 UNIT/ML IV SOLN: 3.0000 10*6.[IU] | INTRAVENOUS | Status: DC

## 2021-12-08 MED ORDER — OXYCODONE-ACETAMINOPHEN 5-325 MG PO TABS: 1.0000 | ORAL_TABLET | ORAL | Status: DC | PRN

## 2021-12-08 MED ORDER — OXYTOCIN BOLUS FROM INFUSION
333.0000 mL | Freq: Once | INTRAVENOUS | Status: AC
  Administered 2021-12-08: 333 mL via INTRAVENOUS

## 2021-12-08 MED ORDER — OXYTOCIN-SODIUM CHLORIDE 30-0.9 UT/500ML-% IV SOLN
2.5000 [IU]/h | INTRAVENOUS | Status: DC
  Filled 2021-12-08: qty 500

## 2021-12-08 MED ORDER — ONDANSETRON HCL 4 MG/2ML IJ SOLN: 4.0000 mg | INTRAMUSCULAR | Status: DC | PRN

## 2021-12-08 MED ORDER — AMPICILLIN SODIUM 2 G IJ SOLR
2.0000 g | Freq: Four times a day (QID) | INTRAMUSCULAR | Status: DC
  Administered 2021-12-08: 2 g via INTRAVENOUS
  Filled 2021-12-08: qty 2000

## 2021-12-08 MED ORDER — DIPHENHYDRAMINE HCL 25 MG PO CAPS: 25.0000 mg | ORAL_CAPSULE | Freq: Four times a day (QID) | ORAL | Status: DC | PRN

## 2021-12-08 MED ORDER — PRENATAL MULTIVITAMIN CH
1.0000 | ORAL_TABLET | Freq: Every day | ORAL | Status: DC
  Administered 2021-12-08 – 2021-12-10 (×3): 1 via ORAL
  Filled 2021-12-08 (×3): qty 1

## 2021-12-08 MED ORDER — DIPHENHYDRAMINE HCL 50 MG/ML IJ SOLN: 12.5000 mg | INTRAMUSCULAR | Status: DC | PRN

## 2021-12-08 MED ORDER — ONDANSETRON HCL 4 MG/2ML IJ SOLN: 4.0000 mg | Freq: Four times a day (QID) | INTRAMUSCULAR | Status: DC | PRN

## 2021-12-08 MED ORDER — LACTATED RINGERS IV SOLN: INTRAVENOUS | Status: DC

## 2021-12-08 MED ORDER — EPHEDRINE 5 MG/ML INJ: 10.0000 mg | INTRAVENOUS | Status: DC | PRN

## 2021-12-08 MED ORDER — ACETAMINOPHEN 325 MG PO TABS: 650.0000 mg | ORAL_TABLET | ORAL | Status: DC | PRN

## 2021-12-08 MED ORDER — DIBUCAINE (PERIANAL) 1 % EX OINT: 1.0000 "application " | TOPICAL_OINTMENT | CUTANEOUS | Status: DC | PRN

## 2021-12-08 MED ORDER — BISACODYL 10 MG RE SUPP: 10.0000 mg | Freq: Every day | RECTAL | Status: DC | PRN

## 2021-12-08 MED ORDER — LIDOCAINE HCL (PF) 1 % IJ SOLN
INTRAMUSCULAR | Status: DC | PRN
  Administered 2021-12-08: 10 mL via EPIDURAL

## 2021-12-08 MED ORDER — WITCH HAZEL-GLYCERIN EX PADS: 1.0000 "application " | MEDICATED_PAD | CUTANEOUS | Status: DC | PRN

## 2021-12-08 MED ORDER — DOCUSATE SODIUM 100 MG PO CAPS
100.0000 mg | ORAL_CAPSULE | Freq: Two times a day (BID) | ORAL | Status: DC
  Administered 2021-12-08 – 2021-12-10 (×4): 100 mg via ORAL
  Filled 2021-12-08 (×4): qty 1

## 2021-12-08 MED ORDER — BENZOCAINE-MENTHOL 20-0.5 % EX AERO
1.0000 "application " | INHALATION_SPRAY | CUTANEOUS | Status: DC | PRN
  Administered 2021-12-08: 1 via TOPICAL
  Filled 2021-12-08: qty 56

## 2021-12-08 MED ORDER — METHYLERGONOVINE MALEATE 0.2 MG PO TABS: 0.2000 mg | ORAL_TABLET | ORAL | Status: DC | PRN

## 2021-12-08 MED ORDER — METHYLERGONOVINE MALEATE 0.2 MG/ML IJ SOLN: 0.2000 mg | INTRAMUSCULAR | Status: DC | PRN

## 2021-12-08 MED ORDER — COCONUT OIL OIL: 1.0000 "application " | TOPICAL_OIL | Status: DC | PRN

## 2021-12-08 MED ORDER — OXYCODONE-ACETAMINOPHEN 5-325 MG PO TABS: 2.0000 | ORAL_TABLET | ORAL | Status: DC | PRN

## 2021-12-08 MED ORDER — SODIUM CHLORIDE 0.9 % IV SOLN: 5.0000 10*6.[IU] | Freq: Once | INTRAVENOUS | Status: DC

## 2021-12-08 MED ORDER — SIMETHICONE 80 MG PO CHEW: 80.0000 mg | CHEWABLE_TABLET | ORAL | Status: DC | PRN

## 2021-12-08 MED ORDER — LACTATED RINGERS IV SOLN: 500.0000 mL | INTRAVENOUS | Status: DC | PRN

## 2021-12-08 MED ORDER — LIDOCAINE HCL (PF) 1 % IJ SOLN: 30.0000 mL | INTRAMUSCULAR | Status: DC | PRN

## 2021-12-08 MED ORDER — PHENYLEPHRINE 40 MCG/ML (10ML) SYRINGE FOR IV PUSH (FOR BLOOD PRESSURE SUPPORT)
80.0000 ug | PREFILLED_SYRINGE | INTRAVENOUS | Status: DC | PRN
  Filled 2021-12-08: qty 10

## 2021-12-08 MED ORDER — LACTATED RINGERS IV SOLN: 500.0000 mL | Freq: Once | INTRAVENOUS | Status: DC

## 2021-12-08 MED ORDER — MEDROXYPROGESTERONE ACETATE 150 MG/ML IM SUSP: 150.0000 mg | INTRAMUSCULAR | Status: DC | PRN

## 2021-12-08 MED ORDER — SOD CITRATE-CITRIC ACID 500-334 MG/5ML PO SOLN: 30.0000 mL | ORAL | Status: DC | PRN

## 2021-12-08 MED ORDER — FENTANYL-BUPIVACAINE-NACL 0.5-0.125-0.9 MG/250ML-% EP SOLN
12.0000 mL/h | EPIDURAL | Status: DC | PRN
  Administered 2021-12-08: 12 mL/h via EPIDURAL
  Filled 2021-12-08: qty 250

## 2021-12-08 MED ORDER — TETANUS-DIPHTH-ACELL PERTUSSIS 5-2.5-18.5 LF-MCG/0.5 IM SUSY: 0.5000 mL | PREFILLED_SYRINGE | Freq: Once | INTRAMUSCULAR | Status: DC

## 2021-12-08 MED ORDER — PHENYLEPHRINE 40 MCG/ML (10ML) SYRINGE FOR IV PUSH (FOR BLOOD PRESSURE SUPPORT): 80.0000 ug | PREFILLED_SYRINGE | INTRAVENOUS | Status: DC | PRN

## 2021-12-08 NOTE — Anesthesia Preprocedure Evaluation (Signed)
Anesthesia Evaluation  °Patient identified by MRN, date of birth, ID band °Patient awake ° ° ° °Reviewed: °Allergy & Precautions, H&P , Patient's Chart, lab work & pertinent test results ° °Airway °Mallampati: I ° ° ° ° ° ° Dental °no notable dental hx. ° °  °Pulmonary °neg pulmonary ROS,  °  °Pulmonary exam normal ° ° ° ° ° ° ° Cardiovascular °negative cardio ROS °Normal cardiovascular exam ° ° °  °Neuro/Psych °negative neurological ROS ° negative psych ROS  ° GI/Hepatic °negative GI ROS, Neg liver ROS,   °Endo/Other  °negative endocrine ROS ° Renal/GU °negative Renal ROS  °negative genitourinary °  °Musculoskeletal °negative musculoskeletal ROS °(+)  ° Abdominal °Normal abdominal exam  (+)   °Peds ° Hematology ° °(+) Blood dyscrasia, anemia ,   °Anesthesia Other Findings ° ° Reproductive/Obstetrics °(+) Pregnancy ° °  ° ° ° ° ° ° ° ° ° ° ° ° ° °  °  ° ° ° ° ° ° ° ° °Anesthesia Physical °Anesthesia Plan ° °ASA: 2 ° °Anesthesia Plan: Epidural  ° °Post-op Pain Management:   ° °Induction:  ° °PONV Risk Score and Plan:  ° °Airway Management Planned:  ° °Additional Equipment:  ° °Intra-op Plan:  ° °Post-operative Plan:  ° °Informed Consent: I have reviewed the patients History and Physical, chart, labs and discussed the procedure including the risks, benefits and alternatives for the proposed anesthesia with the patient or authorized representative who has indicated his/her understanding and acceptance.  ° ° ° ° ° °Plan Discussed with:  ° °Anesthesia Plan Comments:   ° ° ° ° ° ° °Anesthesia Quick Evaluation ° °

## 2021-12-08 NOTE — Discharge Summary (Signed)
? ?  Postpartum Discharge Summary ? ? ? ?   ?Patient Name: Jacqueline Mitchell ?DOB: 1996/03/14 ?MRN: [id number] ? ?Date of admission: 12/08/2021 ?Delivery date:12/08/2021  ?Delivering provider: Christin Fudge  ?Date of discharge: 12/10/2021 ? ?Admitting diagnosis: Indication for care in labor or delivery [O75.9] ?Intrauterine pregnancy: [redacted]w[redacted]d    ?Secondary diagnosis:  Principal Problem: ?  Indication for care in labor or delivery ?Active Problems: ?  Supervision of other normal pregnancy, antepartum ?  GBS bacteriuria ? ?Additional problems: GBS bacteruria     ?Discharge diagnosis: Term Pregnancy Delivered                                              ?Post partum procedures: None ?Augmentation: none ?Complications: None ? ?Hospital course: Onset of Labor With Vaginal Delivery      ?26y.o. yo G2P1001 at 32w4das admitted in Active Labor on 12/08/2021. Patient had an uncomplicated labor course as follows:  ?Membrane Rupture Time/Date: 4:00 AM ,12/08/2021   ?Delivery Method:Vaginal, Spontaneous  ?Episiotomy: None  ?Lacerations:  1st degree;Labial  ?Patient had an uncomplicated postpartum course.  She is ambulating, tolerating a regular diet, passing flatus, and urinating well. Patient is discharged home in stable condition on 12/10/21. ? ?Newborn Data: ?Birth date:12/08/2021  ?Birth time:11:56 AM  ?Gender:Female  ?Living status:Living  ?Apgars:9 ,9  ?[id number]  ? ?Magnesium Sulfate received: No ?BMZ received: No ?Rhophylac:N/A ?MMR:N/A ?T-DaP:Given prenatally ?Flu: Yes ?Transfusion:No ? ?Physical exam  ?Vitals:  ? 12/09/21 0611 12/09/21 1813 12/09/21 2210 12/10/21 0510  ?BP: 91/64 109/67 101/75 107/74  ?Pulse: 78 72 70 65  ?Resp: 16 17 18 18   ?Temp: 98 ?F (36.7 ?C) 98.5 ?F (36.9 ?C) 98.1 ?F (36.7 ?C) 98.2 ?F (36.8 ?C)  ?TempSrc: Oral Oral Oral Oral  ?SpO2: 98% 99% 100% 98%  ?Weight:      ?Height:      ? ?General: alert ?Lochia: appropriate ?Uterine Fundus: firm ?Incision: N/A ?DVT Evaluation: No evidence of DVT seen on  physical exam. ?Labs: ?Lab Results  ?Component Value Date  ? WBC 10.2 12/08/2021  ? HGB 10.1 (L) 12/08/2021  ? HCT 31.7 (L) 12/08/2021  ? MCV 81.3 12/08/2021  ? PLT 348 12/08/2021  ? ?CMP Latest Ref Rng & Units 11/28/2021  ?Glucose 70 - 99 mg/dL 138(H)  ?BUN 6 - 20 mg/dL <5(L)  ?Creatinine 0.44 - 1.00 mg/dL 0.63  ?Sodium 135 - 145 mmol/L 133(L)  ?Potassium 3.5 - 5.1 mmol/L 3.0(L)  ?Chloride 98 - 111 mmol/L 100  ?CO2 22 - 32 mmol/L 24  ?Calcium 8.9 - 10.3 mg/dL 8.3(L)  ?Total Protein 6.5 - 8.1 g/dL 6.4(L)  ?Total Bilirubin 0.3 - 1.2 mg/dL 0.3  ?Alkaline Phos 38 - 126 U/L 161(H)  ?AST 15 - 41 U/L 19  ?ALT 0 - 44 U/L 7  ? ?Edinburgh Score: ?Edinburgh Postnatal Depression Scale Screening Tool 12/09/2021  ?I have been able to laugh and see the funny side of things. 1  ?I have looked forward with enjoyment to things. 0  ?I have blamed myself unnecessarily when things went wrong. 1  ?I have been anxious or worried for no good reason. 2  ?I have felt scared or panicky for no good reason. 0  ?Things have been getting on top of me. 1  ?I have been so unhappy that I have had difficulty sleeping. 1  ?I  have felt sad or miserable. 0  ?I have been so unhappy that I have been crying. 0  ?The thought of harming myself has occurred to me. 0  ?Edinburgh Postnatal Depression Scale Total 6  ? ? ? ?After visit meds:  ?Allergies as of 12/10/2021   ?No Known Allergies ?  ? ?  ?Medication List  ?  ? ?STOP taking these medications   ? ?Iron Polysacch Cmplx-B12-FA 150-0.025-1 MG Caps ?  ? ?  ? ?TAKE these medications   ? ?acetaminophen 325 MG tablet ?Commonly known as: Tylenol ?Take 2 tablets (650 mg total) by mouth every 4 (four) hours as needed (for pain scale < 4). ?What changed:  ?how much to take ?when to take this ?reasons to take this ?  ?Blood Pressure Kit Devi ?1 kit by Does not apply route once a week. ?  ?ferrous sulfate 325 (65 FE) MG tablet ?Take 1 tablet (325 mg total) by mouth every other day. ?  ?ibuprofen 600 MG tablet ?Commonly  known as: ADVIL ?Take 1 tablet (600 mg total) by mouth every 6 (six) hours. ?  ?norethindrone 0.35 MG tablet ?Commonly known as: Ortho Micronor ?Take 1 tablet (0.35 mg total) by mouth daily. ?  ?Vitafol Gummies 3.33-0.333-34.8 MG Chew ?Chew 3 tablets by mouth daily. ?  ? ?  ? ?Discharge home in stable condition ?Infant Feeding: Bottle and Breast ?Infant Disposition:home with mother ?Discharge instruction: per After Visit Summary and Postpartum booklet. ?Activity: Advance as tolerated. Pelvic rest for 6 weeks.  ?Diet: routine diet ?Future Appointments: ?Future Appointments  ?Date Time Provider [place]  ?01/05/2022 10:55 AM Shelly Bombard, MD [facility] None  ? ?Follow up Visit: ?Please schedule this patient for a Virtual postpartum visit in 4 weeks with the following provider: Any provider. ?Additional Postpartum F/U:  ?Low risk pregnancy complicated by: None ?Delivery mode:  Vaginal, Spontaneous  ?Anticipated Birth Control:  POPs ? ?Renard Matter, MD, MPH ?OB Fellow, Faculty [facility] ? ? ? ?

## 2021-12-08 NOTE — Anesthesia Procedure Notes (Signed)
Epidural ?Patient location during procedure: OB ?Start time: 12/08/2021 11:26 AM ?End time: 12/08/2021 11:30 AM ? ?Staffing ?Anesthesiologist: Leilani Able, MD ?Performed: anesthesiologist  ? ?Preanesthetic Checklist ?Completed: patient identified, IV checked, site marked, risks and benefits discussed, surgical consent, monitors and equipment checked, pre-op evaluation and timeout performed ? ?Epidural ?Patient position: sitting ?Prep: DuraPrep and site prepped and draped ?Patient monitoring: continuous pulse ox and blood pressure ?Approach: midline ?Location: L3-L4 ?Injection technique: LOR air ? ?Needle:  ?Needle type: Tuohy  ?Needle gauge: 17 G ?Needle length: 9 cm and 9 ?Needle insertion depth: 4 cm ?Catheter type: closed end flexible ?Catheter size: 19 Gauge ?Catheter at skin depth: 9 cm ?Test dose: negative and Other ? ?Assessment ?Events: blood not aspirated, injection not painful, no injection resistance, no paresthesia and negative IV test ? ?Additional Notes ?Reason for block:procedure for pain ? ? ? ?

## 2021-12-08 NOTE — H&P (Addendum)
OBSTETRIC ADMISSION HISTORY AND PHYSICAL ? ?Jacqueline Mitchell is a 26 y.o. female G2P1001 with IUP at 51w4dpresenting for labor d/t SROM/SOL. She reports +FMs, No LOF, no VB, no blurry vision, headaches or peripheral edema, and RUQ pain.  She plans on bottle and breast feeding. She requests hormonal pill for birth control. ?She received her prenatal care at [facility]? ?Dating: By [place] 05/25/21 --->  Estimated Date of Delivery: 12/11/21 ? ?Sono:   ? ?_0 , CWD, normal anatomy, cephalic presentation, anterior lie, 1787g, 42% EFW ? ? 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 ?Past Medical History: ?Past Medical History:  ?Diagnosis Date  ? Anemia   ? Medical history non-contributory   ? ? ?Past Surgical History: ?Past Surgical History:  ?Procedure Laterality Date  ? NO PAST SURGERIES    ? ? ?Obstetrical History: ?OB History   ? ? Gravida  ?2  ? Para  ?1  ? Term  ?1  ? Preterm  ?   ? AB  ?   ? Living  ?1  ?  ? ? SAB  ?   ? IAB  ?   ? Ectopic  ?   ? Multiple  ?0  ? Live Births  ?1  ?   ?  ?  ? ? ?Social History ?Social History  ? ?Socioeconomic History  ? Marital status: Single  ?  Spouse name: Not on file  ? Number of children: Not on file  ? Years of education: Not on file  ? Highest education level: Not on file  ?Occupational History  ? Not on file  ?Tobacco Use  ? Smoking status: Never  ? Smokeless tobacco: Current  ?  Types: Chew  ?Vaping Use  ? Vaping Use: Never used  ?Substance and Sexual Activity  ? Alcohol use: Not Currently  ?  Comment: Occasional  ? Drug use: Never  ? Sexual activity: Yes  ?  Birth control/protection: None  ?Other Topics Concern  ? Not on file  ?Social History Narrative  ? Not on file  ? ?Social Determinants of Health  ? ?Financial Resource Strain: Not on file  ?Food Insecurity: Not on file  ?Transportation Needs: Not on file  ?Physical Activity: Not on file  ?Stress: Not on file  ?Social  Connections: Not on file  ? ? ?Family History: ?Family History  ?Problem Relation Age of Onset  ? Diabetes Mother   ? Miscarriages / [place]   ? Healthy Father   ? ? ?Allergies: ?No Known Allergies ? ?Medications Prior to Admission  ?Medication Sig Dispense Refill Last Dose  ? Prenatal Vit-Fe Phos-FA-Omega (VITAFOL GUMMIES) 3.33-0.333-34.8 MG CHEW Chew 3 tablets by mouth daily. 90 tablet 3 12/07/2021  ? acetaminophen (TYLENOL) 325 MG tablet Take 3 tablets (975 mg total) by mouth every 6 (six) hours as needed. 60 tablet 0   ? Blood Pressure Monitoring (BLOOD PRESSURE KIT) DEVI 1 kit by Does not apply route once a week. 1 each 0   ? Iron Polysacch Cmplx-B12-FA 150-0.025-1 MG CAPS Take 1 capsule by mouth every other day. 30 capsule 5   ? ? ? ?Review of Systems  ? ?All systems reviewed and negative except as stated in HPI ? ?Blood pressure 104/66, pulse 73, temperature 98.1 ?F (36.7 ?C), temperature source Oral, resp. rate 17, last menstrual period 01/24/2021, SpO2 99 %. ?General appearance: alert, cooperative, and mild distress ?Lungs: clear to auscultation  bilaterally, normal work of breathing on room air ?Heart: regular rate and rhythm, strong pulses bilaterally ?Abdomen: soft, non-tender; bowel sounds normal ?Extremities: Homans sign is negative, no sign of DVT ?Presentation: cephalic ?Fetal monitoring - Baseline: 150 bpm, Variability: Good {> 6 bpm), Accelerations: Reactive, and Decelerations: Absent ?Uterine activityFrequency: Every 2-3 minutes ?Dilation: 5 ?Effacement (%): 80 ?Station: -2 ?Exam by:: K.Wilson,RN ? ? ?Prenatal labs: ?ABO, Rh: B/Positive/-- (08/17 1628) ?Antibody: Negative (08/17 1628) ?Rubella: 4.05 (08/17 1628) ?RPR: Non Reactive (12/07 9861)  ?HBsAg: Negative (08/17 1628)  ?HIV: Non Reactive (12/07 4830)  ?GBS:   Positive in urine ?1 hr Glucola negative ?Genetic screening NIPS low risk female  AFP negative  Horizon negative ?Anatomy US normal on 07/19/21 ? ?Prenatal Transfer Tool   ?Maternal Diabetes: No ?Genetic Screening: Abnormal:  Results: Other: NIPS low risk female  AFP negative  Horizon negative ?Maternal Ultrasounds/Referrals: Normal ?Fetal Ultrasounds or other Referrals:  None ?Maternal Substance Abuse:  No ?Significant Maternal Medications:  None ?Significant Maternal Lab Results: Group B Strep positive in urine ? ?Results for orders placed or performed during the hospital encounter of 12/08/21 (from the past 24 hour(s))  ?POCT fern test  ? Collection Time: 12/08/21  9:33 AM  ?Result Value Ref Range  ? POCT Fern Test Positive = ruptured amniotic membanes   ? ? ?Patient Active Problem List  ? Diagnosis Date Noted  ? 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 Nuchal fold thickening on prenatal ultrasound 08/04/2021  ? GBS bacteriuria 05/29/2021  ? Supervision of other normal pregnancy, antepartum 05/25/2021  ? ? ?Assessment/Plan:  ?Jacqueline Mitchell is a 26 y.o. G2P1001 at 104w4dhere for delivery d/t SROM/SOL. ? ?#Labor: Expectant management, anticipate delivery shortly ?#Pain: Epidural ?#FWB: Cat 1 ?#ID:  GBS+ in urine ?#MOF: Bottle ?#MOC: Pill ? ? ?Dimitry SMining engineer Medical Student  ?12/08/2021, 10:05 AM ? ?I personally saw and evaluated the patient, performing the key elements of the service. I developed and verified the management plan that is described in the resident's/student's note, and I agree with the content with my edits above. VSS, HRR&R, Resp unlabored, Legs neg.  ?FNigel Berthold CNM ?12/08/2021 ?2:07 PM ?  ? ? ?  ?

## 2021-12-08 NOTE — MAU Note (Addendum)
...  Jacqueline Mitchell is a 26 y.o. at [redacted]w[redacted]d here in MAU reporting: CTX and LOF since 0400 this morning. She states the fluid has been clear and white and she has had to change her pants 4 times. She states her CTX are currently 5 minutes apart. +FM. No VB.  ? ? ?Pain score: 10/10 lower abdomen ? ? ? ?

## 2021-12-08 NOTE — Lactation Note (Signed)
This note was copied from a baby's chart. ?Lactation Consultation Note ? ?Patient Name: Jacqueline Mitchell ?Today's Date: 12/08/2021 ?Reason for consult: Initial assessment;Mother's request;Term;Breastfeeding assistance ?Age:24 hours ? ?Mom experienced with breast feeding. She wants to latch during the day and have Dad formula feed at night.  ?Mom does not like to use the pump. She prefers to latch with a cradle hold.  ? ?Plan 1. To feed based on cues 8-12x 24hr period. Mom to offer breasts and look for signs of milk transfer.  ?2. Mom to supplement with formula giving more volume if not accompanied with a latch. LC reviewed use of slow flow nipple and pace bottle feeding with mother. ?BF supplementation guide provided.  ?3. I and O sheet reviewed. ? ?All questions answered at the end of the visit.  ? ? ?Maternal Data ?Has patient been taught Hand Expression?: Yes ?Does the patient have breastfeeding experience prior to this delivery?: Yes ?How long did the patient breastfeed?: 22 months currently nursing only at night. ? ?Feeding ?Mother's Current Feeding Choice: Breast Milk and Formula ? ?LATCH Score ?  ? ?  ? ?  ? ?  ? ?  ? ?  ? ? ?Lactation Tools Discussed/Used ?  ? ?Interventions ?Interventions: Breast feeding basics reviewed;Assisted with latch;Skin to skin;Breast massage;Hand express;Breast compression;Adjust position;Support pillows;Position options;Expressed milk;Education;Infant Driven Feeding Algorithm education;Pace feeding;LC Services brochure ? ?Discharge ?WIC Program: Yes ? ?Consult Status ?Consult Status: Follow-up ?Date: 12/09/21 ?Follow-up type: In-patient ? ? ? ?Jacqueline Ainley  Mitchell ?12/08/2021, 6:57 PM ? ? ? ?

## 2021-12-09 NOTE — Anesthesia Postprocedure Evaluation (Signed)
Anesthesia Post Note ? ?Patient: Jacqueline Mitchell ? ?Procedure(s) Performed: AN AD HOC LABOR EPIDURAL ? ?  ? ?Patient location during evaluation: Mother Baby ?Anesthesia Type: Epidural ?Level of consciousness: awake, oriented and awake and alert ?Pain management: pain level controlled ?Vital Signs Assessment: post-procedure vital signs reviewed and stable ?Respiratory status: spontaneous breathing, respiratory function stable and nonlabored ventilation ?Cardiovascular status: stable ?Postop Assessment: adequate PO intake, able to ambulate, patient able to bend at knees, no headache and no apparent nausea or vomiting ?Anesthetic complications: no ? ? ?No notable events documented. ? ?Last Vitals:  ?Vitals:  ? 12/09/21 0136 12/09/21 0611  ?BP: 95/67 91/64  ?Pulse: 65 78  ?Resp: 16 16  ?Temp: 37 ?C 36.7 ?C  ?SpO2: 97% 98%  ?  ?Last Pain:  ?Vitals:  ? 12/09/21 0850  ?TempSrc:   ?PainSc: 2   ? ?Pain Goal:   ? ?  ?  ?  ?  ?  ?  ?Epidural/Spinal Function Cutaneous sensation: Normal sensation (12/09/21 0850), Patient able to flex knees: Yes (12/09/21 0850), Patient able to lift hips off bed: Yes (12/09/21 0850), Back pain beyond tenderness at insertion site: No (12/09/21 0850), Progressively worsening motor and/or sensory loss: No (12/09/21 0850), Bowel and/or bladder incontinence post epidural: No (12/09/21 0850) ? ?Jacqueline Mitchell ? ? ? ? ?

## 2021-12-09 NOTE — Progress Notes (Signed)
Post Partum Day 1 ?Subjective: ?no complaints, up ad lib, voiding and tolerating PO, small lochia, plans to breastfeed, plans to bottle feed, oral progesterone-only contraceptive ? ?Objective: ?Blood pressure 91/64, pulse 78, temperature 98 ?F (36.7 ?C), temperature source Oral, resp. rate 16, height 5\' 2"  (1.575 m), weight 64.2 kg, last menstrual period 01/24/2021, SpO2 98 %, unknown if currently breastfeeding. ? ?Physical Exam:  ?General: alert, cooperative and no distress ?Lochia:normal flow ?Chest: CTAB ?Heart: RRR no m/r/g ?Abdomen: +BS, soft, nontender,  ?Uterine Fundus: firm ?DVT Evaluation: No evidence of DVT seen on physical exam. ?Extremities: no edema ? ?Recent Labs  ?  12/08/21 ?0936  ?HGB 10.1*  ?HCT 31.7*  ? ? ?Assessment/Plan: ?Plan for discharge tomorrow ? ? LOS: 1 day  ? ?Jacqueline Mitchell ?12/09/2021, 7:45 AM  ? ?

## 2021-12-10 MED ORDER — FERROUS SULFATE 325 (65 FE) MG PO TABS: 325.0000 mg | ORAL_TABLET | ORAL | 0 refills | Status: DC

## 2021-12-10 MED ORDER — NORETHINDRONE 0.35 MG PO TABS: 1.0000 | ORAL_TABLET | Freq: Every day | ORAL | 11 refills | Status: DC

## 2021-12-10 MED ORDER — IBUPROFEN 600 MG PO TABS: 600.0000 mg | ORAL_TABLET | Freq: Four times a day (QID) | ORAL | 0 refills | Status: DC

## 2021-12-10 MED ORDER — ACETAMINOPHEN 325 MG PO TABS: 650.0000 mg | ORAL_TABLET | ORAL | 0 refills | Status: DC | PRN

## 2021-12-12 ENCOUNTER — Telehealth: Payer: Self-pay

## 2021-12-12 NOTE — Telephone Encounter (Signed)
Transition Care Management Follow-up Telephone Call ?Date of discharge and from where: 12/10/2021 from [facility] ?How have you been since you were released from the hospital? Patient stated that she is feeling well and did not have any questions or concerns. ?Any questions or concerns? No ? ?Items Reviewed: ?Did the pt receive and understand the discharge instructions provided? Yes  ?Medications obtained and verified? Yes  ?Other? No  ?Any new allergies since your discharge? No  ?Dietary orders reviewed? No ?Do you have support at home? Yes  ? ?Functional Questionnaire: (I = Independent and D = Dependent) ?ADLs: I ? ?Bathing/Dressing- I ? ?Meal Prep- I ? ?Eating- I ? ?Maintaining continence- I ? ?Transferring/Ambulation- I ? ?Managing Meds- I ? ? ?Follow up appointments reviewed: ? ?PCP Hospital f/u appt confirmed? No   ?Specialist Hospital f/u appt confirmed? Yes  Scheduled to see Coral Ceo, MD on 01/05/2022 @ 10:55am. ?Are transportation arrangements needed? No  ?If their condition worsens, is the pt aware to call PCP or go to the Emergency Dept.? Yes ?Was the patient provided with contact information for the PCP's office or ED? Yes ?Was to pt encouraged to call back with questions or concerns? Yes ? ?

## 2021-12-19 ENCOUNTER — Telehealth (HOSPITAL_COMMUNITY): Payer: Self-pay | Admitting: *Deleted

## 2021-12-19 NOTE — Telephone Encounter (Signed)
Hospital Discharge Follow-Up Call: ? ?Patient reports that she is well and has no concerns about her healing process.  EPDS today was 5 and she endorses this accurately reflects that she is doing well emotionally.  She declines information about our PP groups. ? ?Patient says that baby is well and she has no concerns about baby's health.  They will see the pediatrician again on 12/27/21.  She reports that baby sleeps in a crib.  Reviewed ABCs of Safe Sleep. ?

## 2022-01-05 ENCOUNTER — Ambulatory Visit (INDEPENDENT_AMBULATORY_CARE_PROVIDER_SITE_OTHER): Payer: Medicaid Other | Admitting: Obstetrics

## 2022-01-05 ENCOUNTER — Encounter: Payer: Self-pay | Admitting: Obstetrics

## 2022-01-05 DIAGNOSIS — Z3041 Encounter for surveillance of contraceptive pills: Secondary | ICD-10-CM | POA: Diagnosis not present

## 2022-01-05 NOTE — Progress Notes (Signed)
? ? ?  Post Partum Visit Note ? ?Jacqueline Mitchell is a 26 y.o. G81P2002 female who presents for a postpartum visit. She is 4 week postpartum following a normal spontaneous vaginal delivery.  I have fully reviewed the prenatal and intrapartum course. The delivery was at 39 gestational weeks.  Anesthesia: epidural. Postpartum course has been uinremarkable. Baby is doing well. Baby is feeding by breast. Bleeding none. Bowel function is . Bladder function is  normal . Patient is not sexually active. Contraception method is oral progesterone-only contraceptive. Postpartum depression screening: negative. ? ? ?The pregnancy intention screening data noted above was reviewed. Potential methods of contraception were discussed. The patient elected to proceed with No data recorded. ? ? ? ?Health Maintenance Due  ?Topic Date Due  ? HPV VACCINES (1 - 2-dose series) Never done  ? ? ?The following portions of the patient's history were reviewed and updated as appropriate: allergies, current medications, past family history, past medical history, past social history, past surgical history, and problem list. ? ?Review of Systems ?A comprehensive review of systems was negative. ? ?Objective:  ?There were no vitals taken for this visit.  ? ?General:  alert and no distress  ? Breasts:  normal  ?Lungs: clear to auscultation bilaterally  ?Heart:  regular rate and rhythm, S1, S2 normal, no murmur, click, rub or gallop  ?Abdomen: soft, non-tender; bowel sounds normal; no masses,  no organomegaly   ?Wound none  ?GU exam:  not indicated  ?     ?Assessment:  ? ? 1. Postpartum care following vaginal delivery ?- doing well ? ?2. Encounter for surveillance of contraceptive pills ?- taking progestin-only pill while breast feeding ?  ? ?Plan:  ? ?Essential components of care per ACOG recommendations: ? ?1.  Mood and well being: Patient with negative depression screening today. Reviewed local resources for support.  ?- Patient tobacco use? No.   ?- hx of drug  use? No.   ? ?2. Infant care and feeding:  ?-Patient currently breastmilk feeding? Yes. Discussed returning to work and pumping. Reviewed importance of draining breast regularly to support lactation.  ?-Social determinants of health (SDOH) reviewed in EPIC. No concerns ? ?3. Sexuality, contraception and birth spacing ?- Patient does not want a pregnancy in the next year.  Desired family size is 2 children.  ?- Reviewed reproductive life planning. Reviewed contraceptive methods based on pt preferences and effectiveness.  Patient desired Oral Contraceptive today.   ?- Discussed birth spacing of 18 months ? ?4. Sleep and fatigue ?-Encouraged family/partner/community support of 4 hrs of uninterrupted sleep to help with mood and fatigue ? ?5. Physical Recovery  ?- Discussed patients delivery and complications. She describes her labor as good. ?- Patient had a Vaginal, no problems at delivery. Patient had a 1st degree laceration. Perineal healing reviewed. Patient expressed understanding ?- Patient has urinary incontinence? No. ?- Patient is safe to resume physical and sexual activity ? ?6.  Health Maintenance ?- HM due items addressed Yes ?- Last pap smear  ?Diagnosis  ?Date Value Ref Range Status  ?08/13/2019   Final  ? - Negative for intraepithelial lesion or malignancy (NILM)  ? Pap smear not done at today's visit.  ?-Breast Cancer screening indicated? No.  ? ?7. Chronic Disease/Pregnancy Condition follow up: None ? ? ?Jearld Adjutant, CMA ?[facility] [employer], [facility], [facility] ?01/05/22  ?

## 2022-02-20 IMAGING — US US MFM OB DETAIL+14 WK
1 series · 12 of 28 positions shown · non-contrast
Comparison: none

[Series 1: us mfm ob detail+14 wk · 12 of 129 slices shown]
[im 5/129]
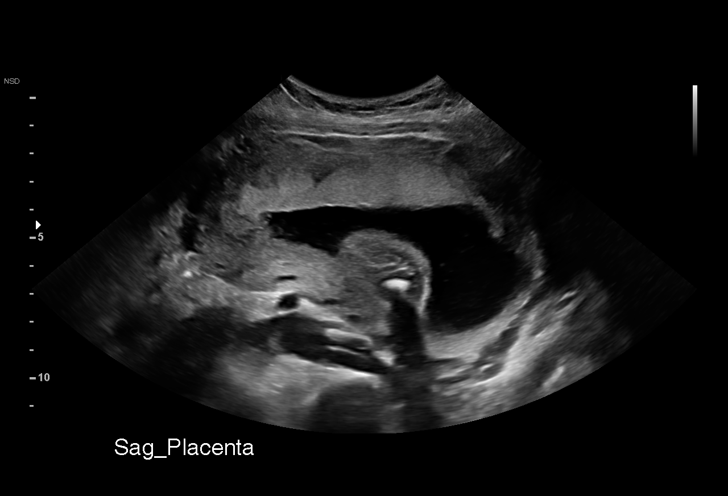
[im 15/129]
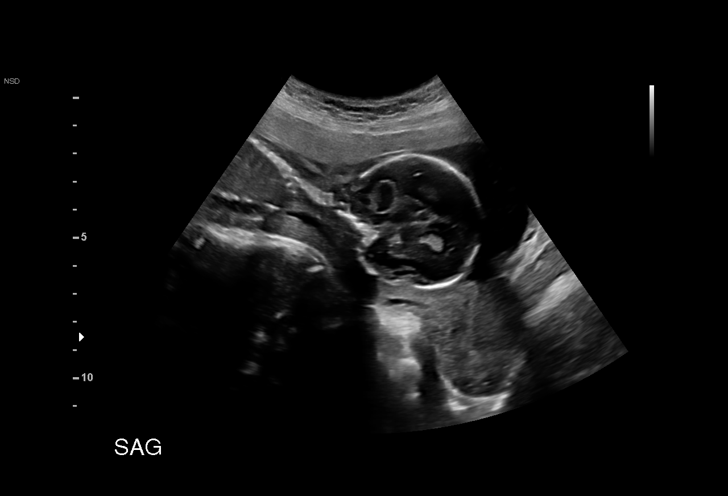
[im 24/129]
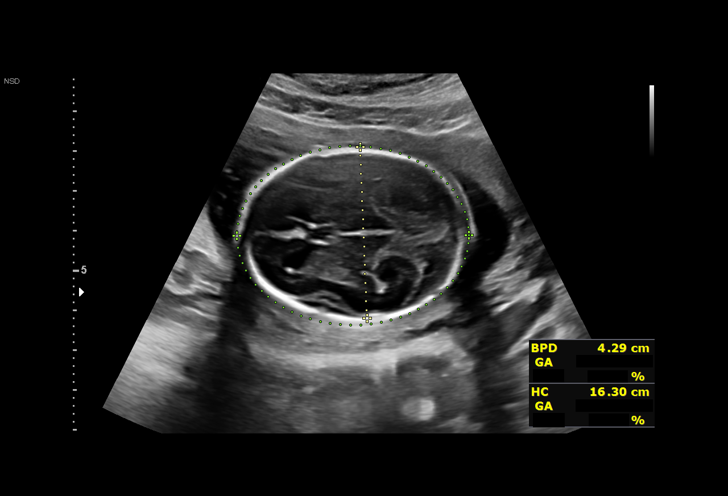
[im 38/129]
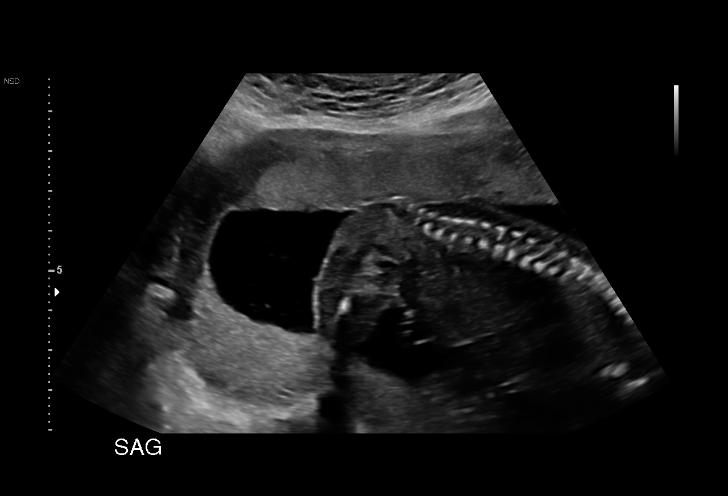
[im 48/129]
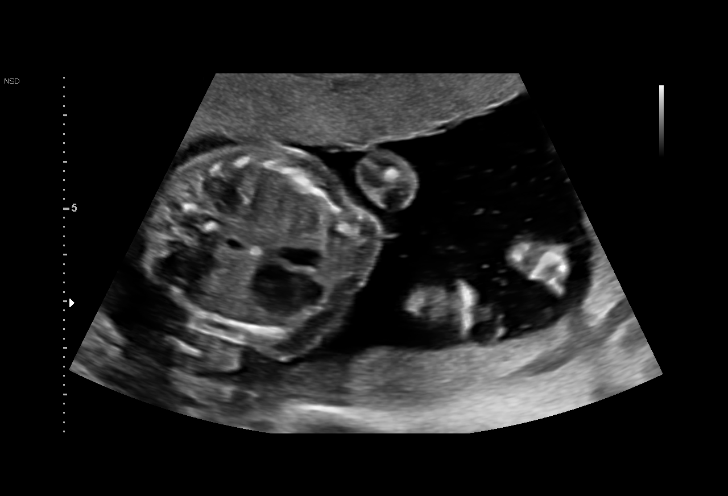
[im 57/129]
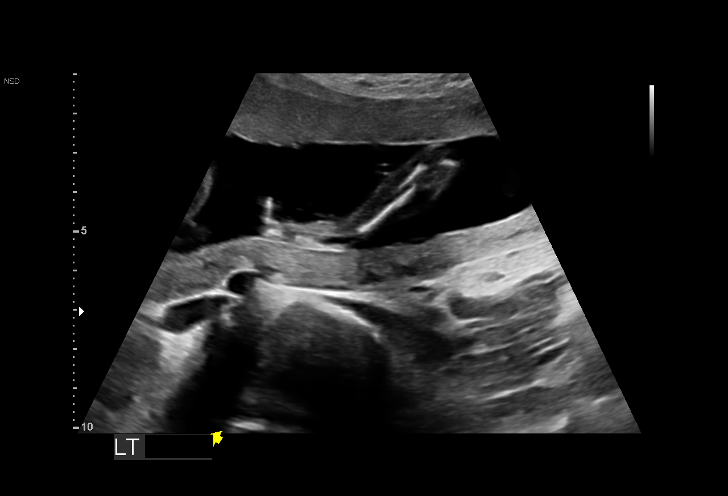
[im 72/129]
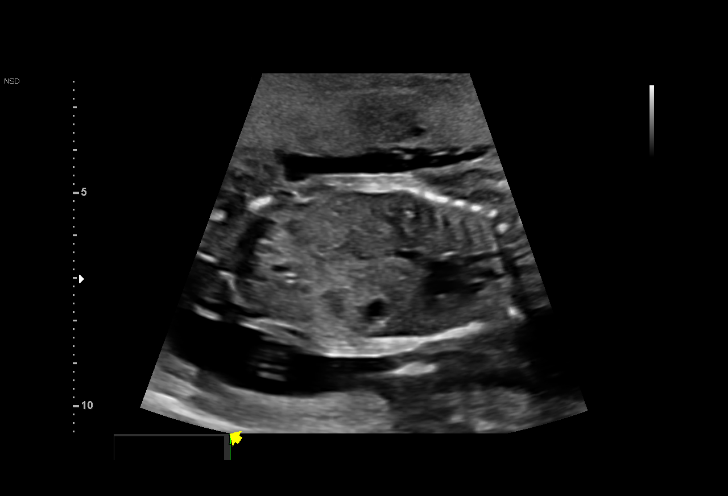
[im 81/129]
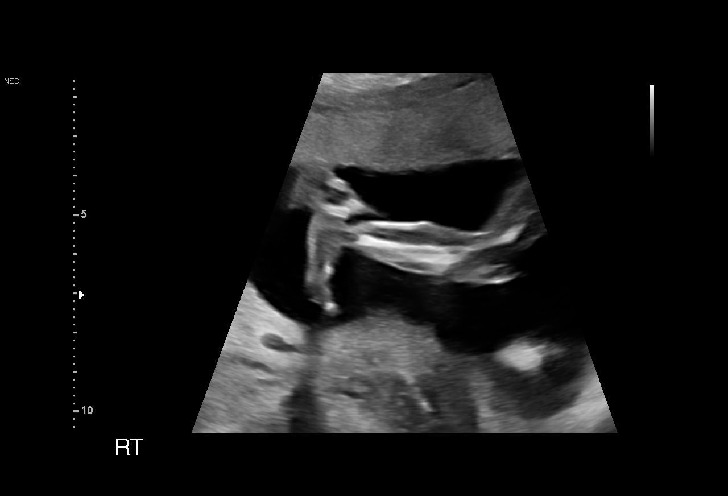
[im 91/129]
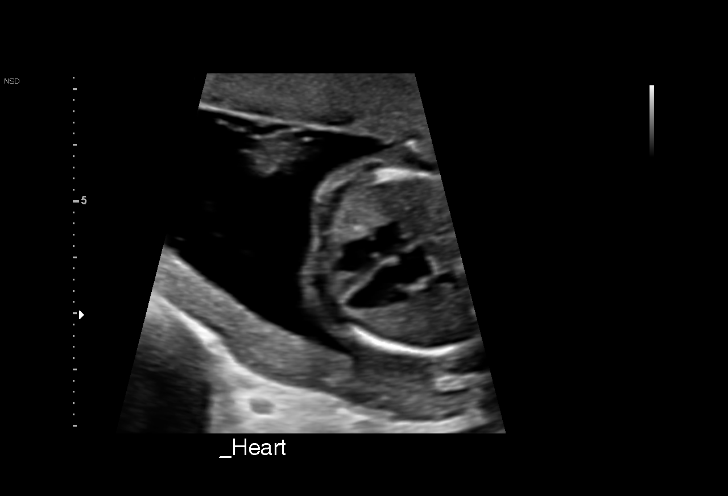
[im 105/129]
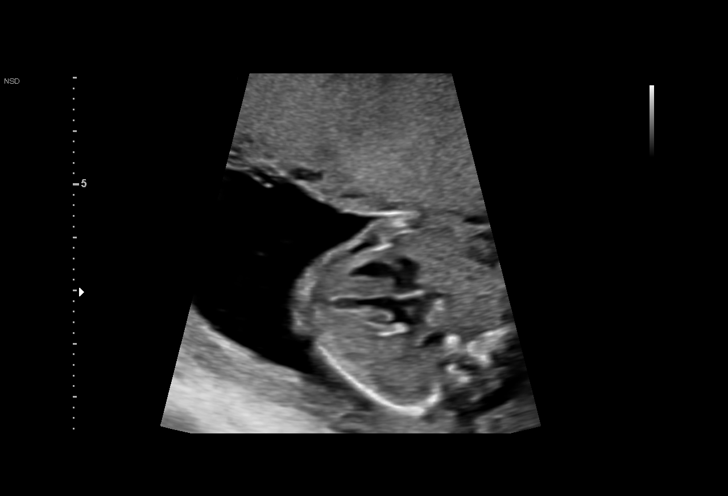
[im 114/129]
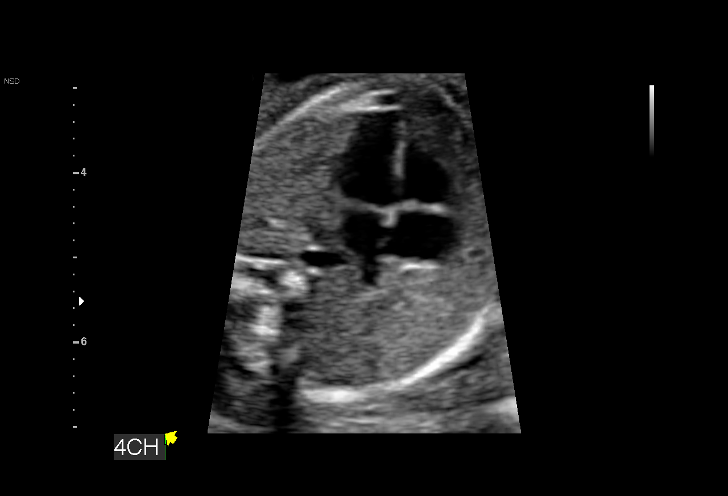
[im 124/129]
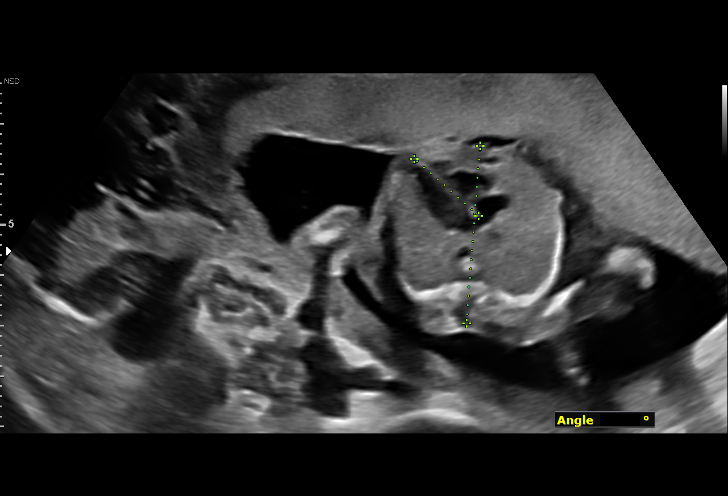

[12 of 28 positions shown; findings below may reference images not displayed]

Indications

 Fetal abnormality - other known or
 suspected (Thickened Nuchal fold)
 Encounter for antenatal screening for
 malformations
 19 weeks gestation of pregnancy
 LOW risk NIPS / Negative AFP
Fetal Evaluation

 Num Of Fetuses:         1
 Fetal Heart Rate(bpm):  129
 Cardiac Activity:       Observed
 Presentation:           Cephalic
 Placenta:               Anterior
 P. Cord Insertion:      Visualized, central

 Amniotic Fluid
 AFI FV:      Within normal limits

                             Largest Pocket(cm)

Biometry

 BPD:      42.7  mm     G. Age:  18w 6d         29  %    CI:        70.23   %    70 - 86
                                                         FL/HC:      16.4   %    16.1 -
 HC:      162.5  mm     G. Age:  19w 0d         24  %    HC/AC:      1.17        1.09 -
 AC:      138.9  mm     G. Age:  19w 2d         40  %    FL/BPD:     62.5   %
 FL:       26.7  mm     G. Age:  18w 1d          7  %    FL/AC:      19.2   %    20 - 24
 HUM:      27.1  mm     G. Age:  18w 4d         30  %
 CER:      20.6  mm     G. Age:  19w 5d         67  %
 NFT:       6.1  mm

 LV:        8.3  mm
 CM:        6.2  mm

 Est. FW:     257  gm      0 lb 9 oz     15  %
OB History

 Gravidity:    2         Term:   1        Prem:   0        SAB:   0
 TOP:          0       Ectopic:  0        Living: 1
Gestational Age

 U/S Today:     18w 6d                                        EDD:   12/15/21
 Best:          19w 3d     Det. By:  U/S C R L  (05/25/21)    EDD:   12/11/21
Anatomy

 Cranium:               Appears normal         LVOT:                   Appears normal
 Cavum:                 Appears normal         Aortic Arch:            Appears normal
 Ventricles:            Appears normal         Ductal Arch:            Appears normal
 Choroid Plexus:        Appears normal         Diaphragm:              Appears normal
 Cerebellum:            Appears normal         Stomach:                Appears normal, left
                                                                       sided
 Posterior Fossa:       Appears normal         Abdomen:                Appears normal
 Nuchal Fold:           Thick Nuchal Fold,     Abdominal Wall:         Appears nml (cord
                        6.2mm
                                                                       insert, abd wall)
 Face:                  Appears normal         Cord Vessels:           Appears normal (3
                        (orbits and profile)                           vessel cord)
 Lips:                  Appears normal         Kidneys:                Appear normal
 Palate:                Not well visualized    Bladder:                Appears normal
 Thoracic:              Appears normal         Spine:                  Appears normal
 Heart:                 Appears normal         Upper Extremities:      Appears normal
                        (4CH, axis, and
                        situs)
 RVOT:                  Appears normal         Lower Extremities:      Appears normal

 Other:  Fetus appears to be female. Heels and 5th digit visualized. Open
         hands visualized. Nasal bone visualized. Technically difficult due to
         fetal position.
Cervix Uterus Adnexa

 Cervix
 Length:            3.7  cm.
 Normal appearance by transabdominal scan.

 Right Ovary
 Within normal limits.

 Left Ovary
 Within normal limits.

 Adnexa
 No abnormality visualized.
Impression

 We performed fetal anatomical survey.  Fetal biometry is
 consistent with the previously established dates.  Amniotic
 fluid is normal and good fetal activity seen.  The nuchal fold
 thickness is increased.  No other markers of aneuploidies or
 fetal structural defects are seen.  Cardiac anatomy appears
 normal.
 xxxxxxxxxxxxxxxxxxxxx
 Consultation (see [REDACTED] )

 [facility] the [facility] She is G2 P1 at 19w 3d gestation and is
 here for fetal anatomy scan.
 On cell free fetal DNA screening, the risks of fetal
 aneuploidies are not increased.  MSAFP screening showed
 low risk for open neural tube defects.
 Obstetric history significant for a term vacuum-assisted
 vaginal delivery of a female infant weighing 7 pounds and 7
 ounces at birth.  Her daughter is in good health.
 Patient reports no chronic medical conditions.
 Our concerns include:
 Thick nuchal fold
 It is seen up to 50% of fetuses with Down syndrome.  Thick
 nuchal fold is also seen and after 2% of euploid fetuses.  I
 explained the finding and counseled her on the significance
 and limitations of cell free fetal DNA screening.  She had low
 risk for fetal Down syndrome on cell free fetal DNA screening.
 I informed her that only amniocentesis will give a definitive
 result on the fetal karyotype.  I explained the procedure and
 possible complication of miscarriage and 1 and 500
 procedures.
 I informed the patient that I do not recommend amniocentesis
 for detection of Down syndrome alone since cell free fetal
 DNA screening has a greater detection rate for Down
 syndrome and has a very low false negative rate.
 Patient understands the limitations of ultrasound in detecting
 aneuploidies and opted not to have amniocentesis.

Recommendations

 -An appointment was made for her to return in 6 weeks for
 fetal growth assessment.
                 Hollenbaugh, Josthyn

## 2022-02-22 ENCOUNTER — Encounter: Payer: Self-pay | Admitting: Obstetrics

## 2022-02-22 ENCOUNTER — Ambulatory Visit (INDEPENDENT_AMBULATORY_CARE_PROVIDER_SITE_OTHER): Payer: Medicaid Other | Admitting: Obstetrics

## 2022-02-22 DIAGNOSIS — Z3041 Encounter for surveillance of contraceptive pills: Secondary | ICD-10-CM

## 2022-02-22 NOTE — Progress Notes (Signed)
? ? ?Post Partum Visit Note ? ?Jacqueline Mitchell is a 26 y.o. G38P2002 female who presents for a postpartum visit. She is 10 weeks postpartum following a normal spontaneous vaginal delivery.  I have fully reviewed the prenatal and intrapartum course. The delivery was at 72 gestational weeks.  Anesthesia: epidural. Postpartum course has been uncomplicated. Baby is doing well. Baby is feeding by both breast and bottle - Gerber Gentle . Bleeding no bleeding. Bowel function is normal. Bladder function is normal. Patient is sexually active. Contraception method is oral progesterone-only contraceptive. Postpartum depression screening: negative. ? ? ?The pregnancy intention screening data noted above was reviewed. Potential methods of contraception were discussed. The patient elected to proceed with No data recorded. ? ? Edinburgh Postnatal Depression Scale - 02/22/22 1110   ? ?  ? Edinburgh Postnatal Depression Scale:  In the Past 7 Days  ? I have been able to laugh and see the funny side of things. 0   ? I have looked forward with enjoyment to things. 0   ? I have blamed myself unnecessarily when things went wrong. 0   ? I have been anxious or worried for no good reason. 0   ? I have felt scared or panicky for no good reason. 0   ? Things have been getting on top of me. 0   ? I have been so unhappy that I have had difficulty sleeping. 0   ? I have felt sad or miserable. 0   ? I have been so unhappy that I have been crying. 0   ? The thought of harming myself has occurred to me. 0   ? Edinburgh Postnatal Depression Scale Total 0   ? ?  ?  ? ?  ? ? ?Health Maintenance Due  ?Topic Date Due  ? HPV VACCINES (1 - 2-dose series) Never done  ? ? ?The following portions of the patient's history were reviewed and updated as appropriate: allergies, current medications, past family history, past medical history, past social history, past surgical history, and problem list. ? ?Review of Systems ?A comprehensive review of systems was  negative. ? ?Objective:  ?BP 98/65   Pulse 92   Ht 5\' 2"  (1.575 m)   Wt 120 lb 11.2 oz (54.7 kg)   Breastfeeding Yes   BMI 22.08 kg/m?   ? ?General:  alert and no distress  ? Breasts:  normal  ?Lungs: clear to auscultation bilaterally  ?Heart:  regular rate and rhythm, S1, S2 normal, no murmur, click, rub or gallop  ?Abdomen: soft, non-tender; bowel sounds normal; no masses,  no organomegaly   ?Wound none  ?GU exam:  not indicated  ?     ?Assessment:  ? ?1. Postpartum care following vaginal delivery ? ?2. Encounter for surveillance of contraceptive pills ?  ? ? ?Plan:  ? ?Essential components of care per ACOG recommendations: ? ?1.  Mood and well being: Patient with negative depression screening today. Reviewed local resources for support.  ?- Patient tobacco use? Yes. Patient desires to quit? No.   ?- hx of drug use? No.   ? ?2. Infant care and feeding:  ?-Patient currently breastmilk feeding? Yes. Discussed returning to work and pumping. Reviewed importance of draining breast regularly to support lactation.  ?-Social determinants of health (SDOH) reviewed in EPIC. No concerns ? ?3. Sexuality, contraception and birth spacing ?- Patient does not want a pregnancy in the next year.  Desired family size is 3 children.  ?- Reviewed  reproductive life planning. Reviewed contraceptive methods based on pt preferences and effectiveness.  Patient desired Oral Contraceptive today.   ?- Discussed birth spacing of 18 months ? ?4. Sleep and fatigue ?-Encouraged family/partner/community support of 4 hrs of uninterrupted sleep to help with mood and fatigue ? ?5. Physical Recovery  ?- Discussed patients delivery and complications. She describes her labor as good. ?- Patient had a Vaginal, no problems at delivery. Patient had a  labial  laceration.  ?- Patient has urinary incontinence? No. ?- Patient is safe to resume physical and sexual activity ? ?6.  Health Maintenance ?- HM due items addressed Yes ?- Last pap smear   ?Diagnosis  ?Date Value Ref Range Status  ?08/13/2019   Final  ? - Negative for intraepithelial lesion or malignancy (NILM)  ? Pap smear not done at today's visit.  ?-Breast Cancer screening indicated? No.  ? ?7. Chronic Disease/Pregnancy Condition follow up: None ? ? ? ?Baltazar Najjar, MD ?[facility] [place] ?02/22/22  ?

## 2022-04-03 IMAGING — US US MFM OB FOLLOW-UP
1 series · 13 of 28 positions shown · non-contrast
Comparison: none

[Series 1: us mfm ob follow-up · 13 of 48 slices shown]
[im 2/48]
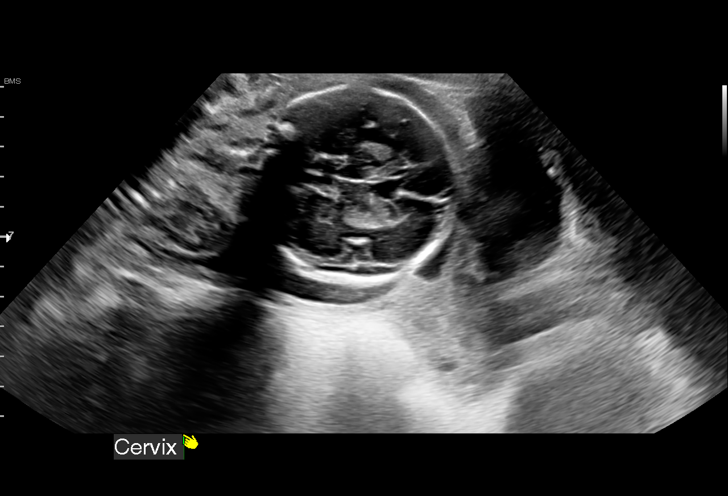
[im 6/48]
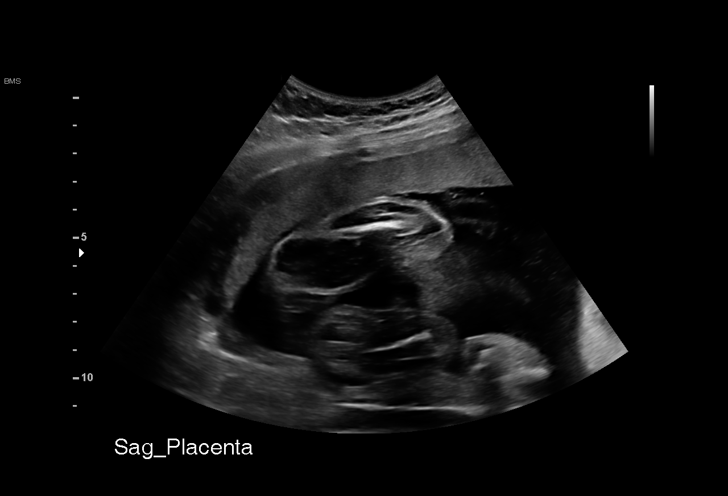
[im 9/48]
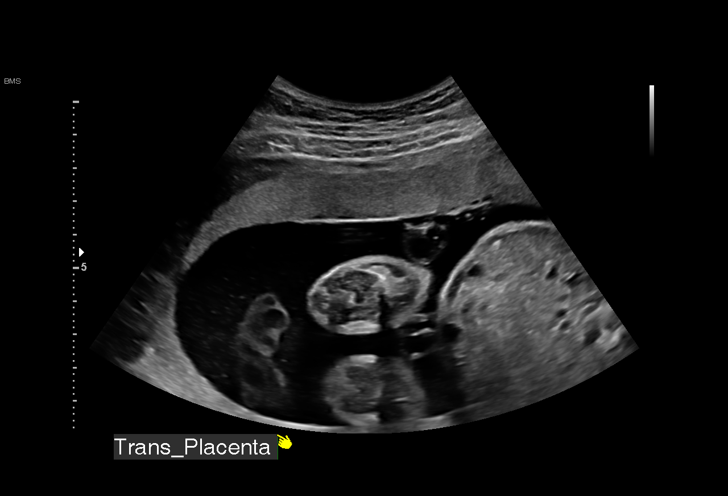
[im 13/48]
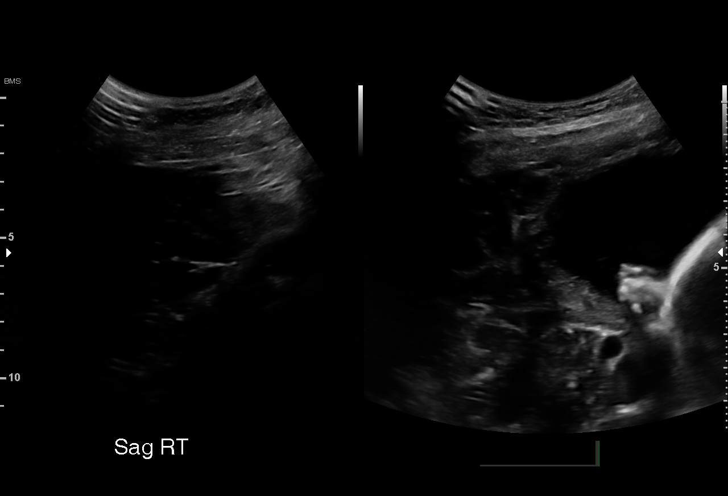
[im 16/48]
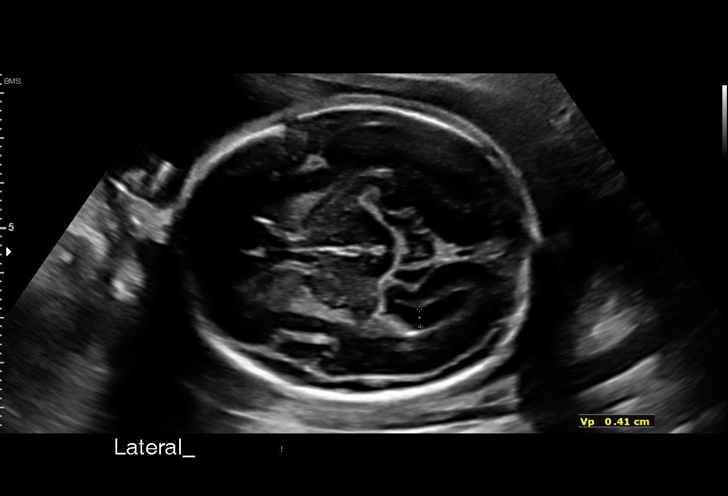
[im 20/48]
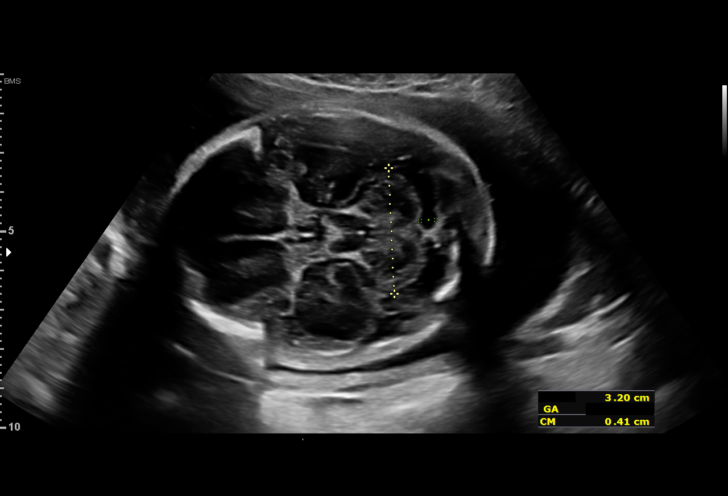
[im 25/48]
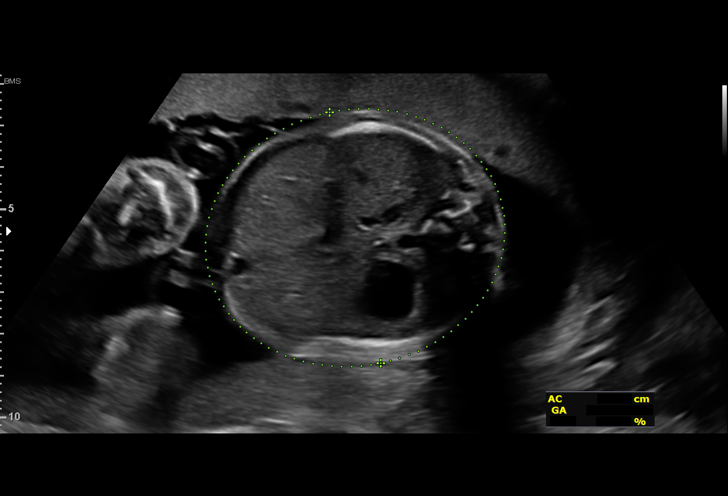
[im 28/48]
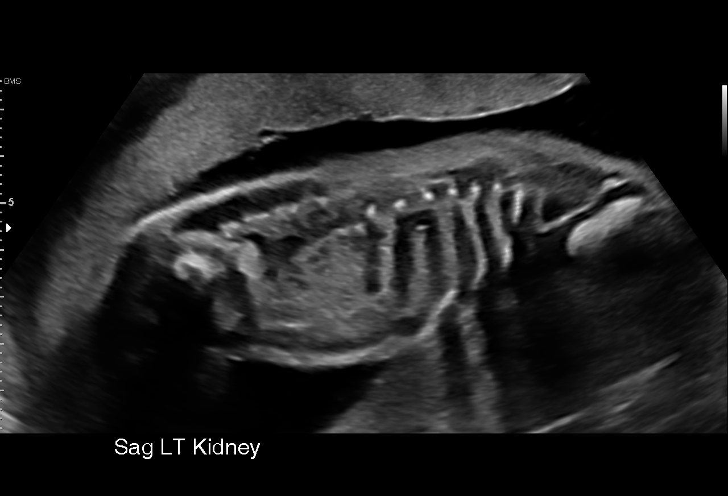
[im 32/48]
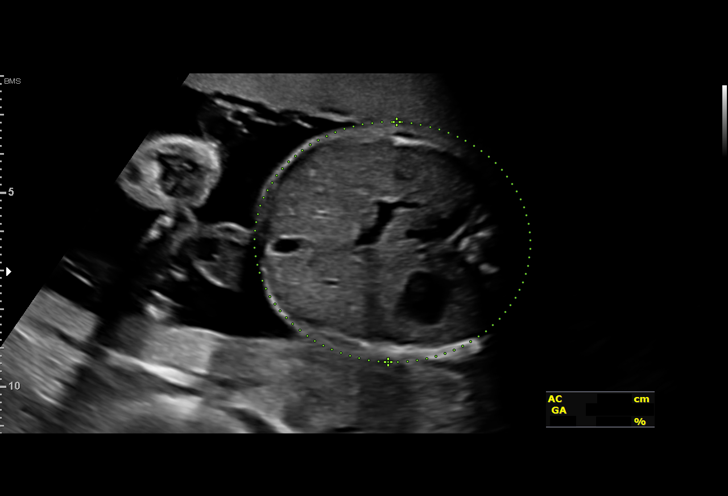
[im 35/48]
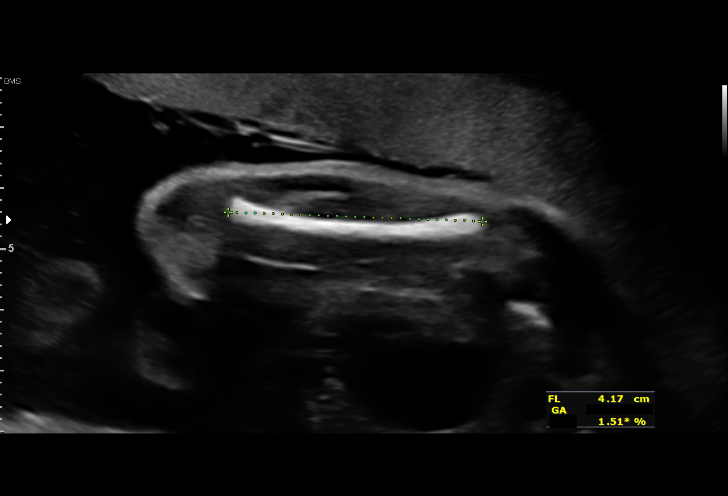
[im 39/48]
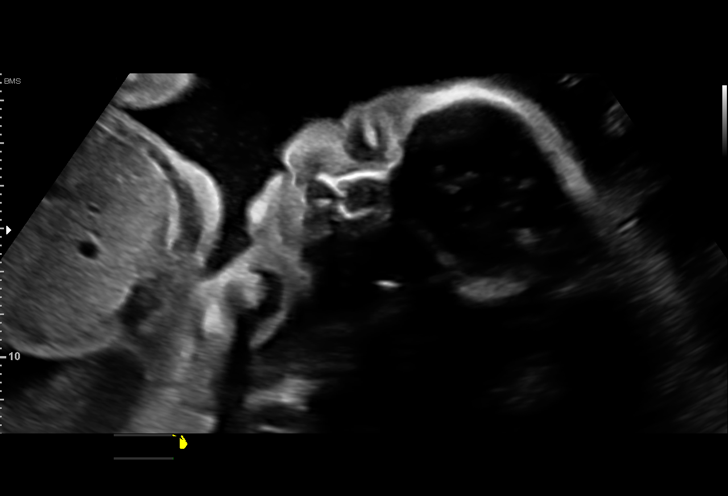
[im 42/48]
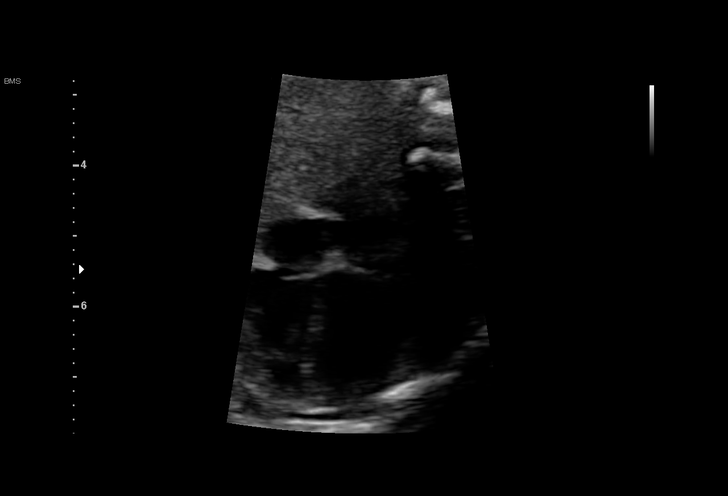
[im 46/48]
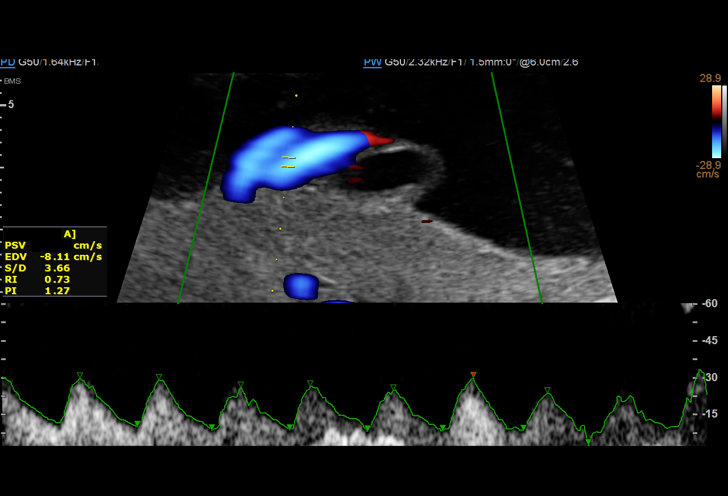

[13 of 28 positions shown; findings below may reference images not displayed]

Indications

 Fetal abnormality - other known or
 suspected (Thickened Nuchal fold)
 Encounter for antenatal screening for
 malformations
 LOW risk NIPS / Negative AFP
 25 weeks gestation of pregnancy
Fetal Evaluation

 Num Of Fetuses:         1
 Fetal Heart Rate(bpm):  153
 Cardiac Activity:       Observed
 Presentation:           Cephalic
 Placenta:               Anterior
 P. Cord Insertion:      Visualized, central

 Amniotic Fluid
 AFI FV:      Within normal limits

                             Largest Pocket(cm)

Biometry

 BPD:      64.4  mm     G. Age:  26w 0d         63  %    CI:        75.18   %    70 - 86
                                                         FL/HC:      17.7   %    18.7 -
 HC:      235.6  mm     G. Age:  25w 4d         33  %    HC/AC:      1.13        1.04 -
 AC:      208.7  mm     G. Age:  25w 3d         41  %    FL/BPD:     64.6   %    71 - 87
 FL:       41.6  mm     G. Age:  23w 4d        2.5  %    FL/AC:      19.9   %    20 - 24
 HUM:      39.8  mm     G. Age:  24w 2d         16  %
 CER:        32  mm     G. Age:  27w 4d         92  %

 LV:        4.1  mm
 CM:        4.1  mm

 Est. FW:     734  gm    1 lb 10 oz      16  %
OB History

 Gravidity:    2         Term:   1        Prem:   0        SAB:   0
 TOP:          0       Ectopic:  0        Living: 1
Gestational Age

 U/S Today:     25w 1d                                        EDD:   12/13/21
 Best:          25w 3d     Det. By:  U/S C R L  (05/25/21)    EDD:   12/11/21
Anatomy

 Cranium:               Appears normal         LVOT:                   Previously seen
 Cavum:                 Appears normal         Aortic Arch:            Previously seen
 Ventricles:            Appears normal         Ductal Arch:            Previously seen
 Choroid Plexus:        Previously seen        Diaphragm:              Appears normal
 Cerebellum:            Appears normal         Stomach:                Appears normal, left
                                                                       sided
 Posterior Fossa:       Appears normal         Abdomen:                Appears normal
 Nuchal Fold:           Thick Nuchal Fold,     Abdominal Wall:         Previously seen
                        6.2mm prev vis
 Face:                  Orbits and profile     Cord Vessels:           Previously seen
                        previously seen
 Lips:                  Previously seen        Kidneys:                Appear normal
 Palate:                Not well visualized    Bladder:                Appears normal
 Thoracic:              Previously seen        Spine:                  Previously seen
 Heart:                 Appears normal         Upper Extremities:      Previously seen
                        (4CH, axis, and
                        situs)
 RVOT:                  Previously seen        Lower Extremities:      Previously seen

 Other:  Fetus appears to be female. Heels and 5th digit prev visualized. Open
         hands prev visualized. Nasal bone prev visualized.
Cervix Uterus Adnexa

 Cervix
 Normal appearance by transabdominal scan.

 Uterus
 No abnormality visualized.

 Right Ovary
 Not visualized.

 Left Ovary
 Not visualized.

 Cul De Sac
 No free fluid seen.
 Adnexa
 No adnexal mass visualized.
Impression

 Follow up growth due to prior thickend nuchal fold with low
 risk NIPS. Prior consultation was performed on in [REDACTED]
 Normal interval growth with measurements consistent with
 dates
 Good fetal movement and amniotic fluid volume

 Ms. Pelican was previoulsy counseled regarding a thickend
 nuchal fold. She declined diagnostic testing at that time.

 I discussed the normal nature of today's visit. She had not
 further questions.
Recommendations

 Follow up growth scheduled at 32 week.

## 2023-01-17 ENCOUNTER — Encounter: Payer: Self-pay | Admitting: Obstetrics and Gynecology

## 2023-01-17 ENCOUNTER — Ambulatory Visit (INDEPENDENT_AMBULATORY_CARE_PROVIDER_SITE_OTHER): Payer: Medicaid Other | Admitting: Obstetrics and Gynecology

## 2023-01-17 ENCOUNTER — Other Ambulatory Visit: Payer: Self-pay | Admitting: Obstetrics and Gynecology

## 2023-01-17 VITALS — BP 107/71 | HR 86 | Ht 62.0 in | Wt 117.0 lb

## 2023-01-17 DIAGNOSIS — Z3400 Encounter for supervision of normal first pregnancy, unspecified trimester: Secondary | ICD-10-CM

## 2023-01-17 DIAGNOSIS — Z319 Encounter for procreative management, unspecified: Secondary | ICD-10-CM | POA: Diagnosis not present

## 2023-01-17 NOTE — Progress Notes (Signed)
Ms Drew presents with desire for pregnancy Pt had a TSVD 3/23. Pt reports trying to conceive since without success Reports monthly cycles LMP 12/25/22, last 2 days W/U for secondary amenorrhea in the past was negative.  PE AF VSS Lungs clear Heart RRR Abd soft + BS  A/P Desire for pregnancy  Reviewed with pt. Will check labs on CD 21 CD 21 time reviewed with pt)and information on semen analysis provided to pt. Advised to use OTC ovulation prediction kits PNV qd F/U per test results

## 2023-01-17 NOTE — Progress Notes (Signed)
27 y.o. GYN presents for problem visit.  Pt has been trying to conceive for 1 year.  Last delivery was 12/08/21.

## 2023-06-21 ENCOUNTER — Ambulatory Visit (INDEPENDENT_AMBULATORY_CARE_PROVIDER_SITE_OTHER): Payer: Medicaid Other | Admitting: Emergency Medicine

## 2023-06-21 VITALS — BP 102/70 | HR 80 | Ht 61.0 in | Wt 113.0 lb

## 2023-06-21 DIAGNOSIS — Z3201 Encounter for pregnancy test, result positive: Secondary | ICD-10-CM | POA: Diagnosis not present

## 2023-06-21 LAB — POCT URINE PREGNANCY: Preg Test, Ur: POSITIVE — AB

## 2023-06-21 MED ORDER — CVS PRENATAL GUMMY 0.18-25 MG PO CHEW: 1.0000 | CHEWABLE_TABLET | Freq: Every day | ORAL | 13 refills | Status: DC

## 2023-06-28 ENCOUNTER — Other Ambulatory Visit (INDEPENDENT_AMBULATORY_CARE_PROVIDER_SITE_OTHER): Payer: Medicaid Other

## 2023-06-28 ENCOUNTER — Ambulatory Visit (INDEPENDENT_AMBULATORY_CARE_PROVIDER_SITE_OTHER): Payer: Medicaid Other | Admitting: *Deleted

## 2023-06-28 VITALS — BP 98/63 | HR 80 | Wt 117.1 lb

## 2023-06-28 DIAGNOSIS — O3680X0 Pregnancy with inconclusive fetal viability, not applicable or unspecified: Secondary | ICD-10-CM

## 2023-06-28 DIAGNOSIS — Z1339 Encounter for screening examination for other mental health and behavioral disorders: Secondary | ICD-10-CM

## 2023-06-28 DIAGNOSIS — Z3480 Encounter for supervision of other normal pregnancy, unspecified trimester: Secondary | ICD-10-CM | POA: Insufficient documentation

## 2023-06-28 DIAGNOSIS — Z3A01 Less than 8 weeks gestation of pregnancy: Secondary | ICD-10-CM | POA: Diagnosis not present

## 2023-06-28 DIAGNOSIS — Z3481 Encounter for supervision of other normal pregnancy, first trimester: Secondary | ICD-10-CM

## 2023-06-28 DIAGNOSIS — Z3A10 10 weeks gestation of pregnancy: Secondary | ICD-10-CM

## 2023-06-28 MED ORDER — CVS PRENATAL GUMMY 0.18-25 MG PO CHEW: 1.0000 | CHEWABLE_TABLET | Freq: Every day | ORAL | 13 refills | Status: DC

## 2023-06-28 MED ORDER — BLOOD PRESSURE KIT DEVI: 1.0000 | 0 refills | Status: DC

## 2023-06-28 NOTE — Patient Instructions (Signed)

## 2023-06-28 NOTE — Progress Notes (Signed)
New OB Intake  I connected with Jacqueline Mitchell  on 06/28/23 at  2:10 PM EDT by In [facility] Visit and verified that I am speaking with the correct person using two identifiers. Nurse is located at [facility] and pt is located at [place].  I discussed the limitations, risks, security and privacy concerns of performing an evaluation and management service by telephone and the availability of in person appointments. I also discussed with the patient that there may be a patient responsible charge related to this service. The patient expressed understanding and agreed to proceed.  I explained I am completing New OB Intake today. We discussed EDD of 01/23/2024, by Last Menstrual Period. Pt is G3P2002. I reviewed her allergies, medications and Medical/Surgical/OB history.    Patient Active Problem List   Diagnosis Date Noted   Desire for pregnancy 01/17/2023    Concerns addressed today  Delivery Plans Plans to deliver at [facility]. Discussed the nature of our practice with multiple providers including residents and students. Due to the size of the practice, the delivering provider may not be the same as those providing prenatal care.   Patient is not interested in water birth. Offered upcoming OB visit with CNM to discuss further.  MyChart/Babyscripts MyChart access verified. I explained pt will have some visits in office and some virtually. Babyscripts instructions given and order placed. Patient verifies receipt of registration text/e-mail. Account successfully created and app downloaded.  Blood Pressure Cuff/Weight Scale Blood pressure cuff ordered for patient to pick-up from [employer]. Explained after first prenatal appt pt will check weekly and document in Babyscripts. Patient does not have weight scale; patient may purchase if they desire to track weight weekly in Babyscripts.  Anatomy US Explained first scheduled [name] will be around 19 weeks. Anatomy US scheduled for TBD at TBD.  Interested in  [facility]? If yes, send referral and doula dot phrase.   Is patient a candidate for Babyscripts Optimization? Yes  First visit review I reviewed new OB appt with patient. Explained pt will be seen by Dr. Judd Lien at first visit. Discussed [name] genetic screening with patient. Offered [employer] and [employer].. Routine prenatal labs  not collected, viability not confirmed.    Last Pap Diagnosis  Date Value Ref Range Status  08/13/2019   Final   - Negative for intraepithelial lesion or malignancy (NILM)    Harrel Lemon, RN 06/28/2023  2:04 PM

## 2023-07-11 ENCOUNTER — Ambulatory Visit (INDEPENDENT_AMBULATORY_CARE_PROVIDER_SITE_OTHER): Payer: Medicaid Other | Admitting: *Deleted

## 2023-07-11 ENCOUNTER — Other Ambulatory Visit: Payer: Self-pay

## 2023-07-11 VITALS — BP 101/67 | HR 88 | Wt 117.1 lb

## 2023-07-11 DIAGNOSIS — Z3A01 Less than 8 weeks gestation of pregnancy: Secondary | ICD-10-CM

## 2023-07-11 DIAGNOSIS — O3680X0 Pregnancy with inconclusive fetal viability, not applicable or unspecified: Secondary | ICD-10-CM

## 2023-07-11 NOTE — Progress Notes (Addendum)
Jacqueline Mitchell returns for follow up US today. Previous US showed intrauterine GS and possible FP and YS. Today's scan shows larger GS. No FP, YS seen. Consulted with Dr. Berton Lan. Recommends rescan tomorrow (14 days from 06/28/23 scan). Orders placed for formal US, STAT tomorrow w/ virtual appt to be scheduled with MD following scan to discuss results and management. [name] scheduled for 07/12/23 @ 3pm. MD appt for discussion scheduled for 07/13/23 @ 10:35 am.

## 2023-07-12 ENCOUNTER — Ambulatory Visit (HOSPITAL_COMMUNITY)
Admission: RE | Admit: 2023-07-12 | Discharge: 2023-07-12 | Disposition: A | Discharge status: Home / Self Care | Payer: Medicaid Other | Source: Ambulatory Visit | Attending: Obstetrics and Gynecology | Admitting: Obstetrics and Gynecology

## 2023-07-12 DIAGNOSIS — O3680X0 Pregnancy with inconclusive fetal viability, not applicable or unspecified: Secondary | ICD-10-CM | POA: Diagnosis present

## 2023-07-12 NOTE — Progress Notes (Signed)
Pt presents for UPT UPT positive in office LMP 04/18/2023 Prenatal care to do completed at [facility]

## 2023-07-13 ENCOUNTER — Encounter: Payer: Self-pay | Admitting: Obstetrics and Gynecology

## 2023-07-13 ENCOUNTER — Telehealth: Payer: Medicaid Other | Admitting: Obstetrics and Gynecology

## 2023-07-13 DIAGNOSIS — Z3A01 Less than 8 weeks gestation of pregnancy: Secondary | ICD-10-CM

## 2023-07-13 DIAGNOSIS — O021 Missed abortion: Secondary | ICD-10-CM | POA: Diagnosis not present

## 2023-07-13 MED ORDER — MISOPROSTOL 200 MCG PO TABS: ORAL_TABLET | ORAL | 1 refills | Status: DC

## 2023-07-13 NOTE — Progress Notes (Signed)
GYNECOLOGY VIRTUAL VISIT ENCOUNTER NOTE  [facility]   Patient location: Home  I connected with Andreana Blish on 07/13/23 at 10:35 AM EDT by MyChart Video Encounter and verified that I am speaking with the correct person using two identifiers.   I discussed the limitations, risks, security and privacy concerns of performing an evaluation and management service virtually and the availability of in person appointments. I also discussed with the patient that there may be a patient responsible charge related to this service. The patient expressed understanding and agreed to proceed.   History:  Jacqueline Mitchell is a 27 y.o. G13P2002 female being evaluated today to discuss recent ultrasound results. She denies any abnormal vaginal discharge, bleeding, pelvic pain or other concerns.  Reviewed ultrasound results with the patient which demonstrated a failed pregnancy at 6 weeks     Past Medical History:  Diagnosis Date   Anemia    Past Surgical History:  Procedure Laterality Date   NO PAST SURGERIES     The following portions of the patient's history were reviewed and updated as appropriate: allergies, current medications, past family history, past medical history, past social history, past surgical history and problem list.   Health Maintenance:  Normal pap and negative HRHPV on 08/2019.     Review of Systems:  Pertinent items noted in HPI and remainder of comprehensive ROS otherwise negative.  Physical Exam:   General:  Alert, oriented and cooperative. Patient appears to be in no acute distress.  Mental Status: Normal mood and affect. Normal behavior. Normal judgment and thought content.   Respiratory: Normal respiratory effort, no problems with respiration noted  Rest of physical exam deferred due to type of encounter  Labs and Imaging No results found for this or any previous visit (from the past 336 hour(s)). US OB LESS THAN 14 WEEKS WITH OB  TRANSVAGINAL  Result Date: 07/12/2023 CLINICAL DATA:  Follow-up 6 week and 1 day pregnancy with no fetal cardiac activity on a limited early Ob ultrasound dated 06/28/2023. EXAM: OBSTETRIC <14 WK ULTRASOUND TECHNIQUE: Transabdominal ultrasound was performed for evaluation of the gestation as well as the maternal uterus and adnexal regions. COMPARISON:  06/28/2023 and 07/11/2023 obtained elsewhere. FINDINGS: Intrauterine gestational sac: Visualized, large and misshapen with floating internal echoes. Yolk sac:  Visualized, small Embryo:  Visualized Cardiac Activity: Not visualized CRL:   4.1 mm mm   6 w 1 d Subchorionic hemorrhage:  Not visualized Maternal uterus/adnexae: Normal-appearing ovaries. No free peritoneal fluid. IMPRESSION: 1. Single intrauterine pregnancy with an estimated gestational age by fetal crown-rump length of 6 weeks and 1 day with no fetal cardiac activity. This confirms the previously suspected fetal demise at 6 weeks and 1 day on the ultrasound dated 06/28/2023. 2. No other abnormalities seen. These results will be called to the ordering clinician or representative by the Radiologist Assistant, and communication documented in the PACS or [employer]. Electronically Signed   By: Beckie Salts M.D.   On: 07/12/2023 18:37   US OB Limited  Result Date: 07/09/2023 ----------------------------------------------------------------------  OBSTETRICS REPORT                       (Signed Final 07/09/2023 01:59 pm) ---------------------------------------------------------------------- Patient Info  ID #:       [id number]                          D.O.B.:  [birth year] (27 yrs)  [facility]:       [facility]                         Visit Date: 06/28/2023 03:13 pm ---------------------------------------------------------------------- Performed By  Attending:        Tinnie Gens MD         Ref. Address:     [address]                                                              [address]                                                             [place]                                                             [id number]  Performed By:     Montez Morita RN        Location:         [facility]                                                             [facility]  Referred By:      [facility] ---------------------------------------------------------------------- Orders  #  Description                           Code        Ordered By  1  US OB LIMITED  11914.7     KELLY DAVIS ----------------------------------------------------------------------  #  Order #                     Accession #                Episode #  1  [id number]                   [id number]                 [id number] ---------------------------------------------------------------------- Indications  Weeks of gestation of pregnancy not            Z3A.00  specified ---------------------------------------------------------------------- Fetal Evaluation  Num Of Fetuses:         1  Preg. Location:         Intrauterine  Gest. Sac:              Intrauterine  Yolk Sac:               Visualized  Fetal Pole:             Visualized  Cardiac Activity:       Absent ---------------------------------------------------------------------- Biometry  GS:       29.5  mm     G. Age:  8w 5d                   EDD:   02/02/24  CRL:       4.3  mm     G. Age:  6w 1d                   EDD:   02/20/24 ---------------------------------------------------------------------- Comments  IUP at [redacted]w[redacted]d by CRL.  LMP 04/18/23 but cycle is very irregular.  FP and YS visualized. No fetal heart motion noted. Plan to  repeat US in 2 weeks to determine viability. ---------------------------------------------------------------------- Impression  Single somewhat irregular  intrauterine  gestational sac with ?  yolk sac and  ? FP  No cardiac activity and ? viability ---------------------------------------------------------------------- Recommendations  Repeat u/s in 10-14 days to assess progression ----------------------------------------------------------------------                  Tinnie Gens, MD Electronically Signed Final Report   07/09/2023 01:59 pm ----------------------------------------------------------------------       Assessment and Plan:     1. Missed abortion with fetal demise before 20 completed weeks of gestation - Emotional support provided to the patient - Discussed management options including expectant management, medical management with misoprostol and surgical management with D&E.  - Patient opted for medical management. Rx e-prescribed.  - Blood type B pos. Rhogam not indicated - Patient to return in 2 weeks for follow up and annual exam with pap smear       I discussed the assessment and treatment plan with the patient. The patient was provided an opportunity to ask questions and all were answered. The patient agreed with the plan and demonstrated an understanding of the instructions.   The patient was advised to call back or seek an in-person evaluation/go to the ED if the symptoms worsen or if the condition fails to improve as anticipated.  I provided 15 minutes of face-to-face time during this encounter.   Catalina Antigua, MD [facility] [employer], [facility]

## 2023-07-17 ENCOUNTER — Encounter: Payer: Medicaid Other | Admitting: Obstetrics & Gynecology

## 2023-07-17 ENCOUNTER — Encounter: Payer: Medicaid Other | Admitting: Family Medicine

## 2023-07-31 ENCOUNTER — Other Ambulatory Visit (HOSPITAL_COMMUNITY)
Admission: RE | Admit: 2023-07-31 | Discharge: 2023-07-31 | Disposition: A | Discharge status: Home / Self Care | Payer: Medicaid Other | Source: Ambulatory Visit

## 2023-07-31 ENCOUNTER — Ambulatory Visit (INDEPENDENT_AMBULATORY_CARE_PROVIDER_SITE_OTHER): Payer: Medicaid Other

## 2023-07-31 VITALS — BP 113/83 | HR 114 | Ht 61.0 in | Wt 111.0 lb

## 2023-07-31 DIAGNOSIS — Z3A01 Less than 8 weeks gestation of pregnancy: Secondary | ICD-10-CM

## 2023-07-31 DIAGNOSIS — N898 Other specified noninflammatory disorders of vagina: Secondary | ICD-10-CM

## 2023-07-31 DIAGNOSIS — Z09 Encounter for follow-up examination after completed treatment for conditions other than malignant neoplasm: Secondary | ICD-10-CM

## 2023-07-31 DIAGNOSIS — O021 Missed abortion: Secondary | ICD-10-CM

## 2023-07-31 NOTE — Progress Notes (Signed)
Pt presents for SAB f/u from 10/4. Medical management for SAB, reports continued bleeding and cramping today.   Declines pap smear today d/t vaginal bleeding.

## 2023-07-31 NOTE — Progress Notes (Signed)
GYNECOLOGY  OFFICE VISIT NOTE: SAB FOLLOW-UP  History:  Jacqueline Mitchell is a 27 y.o. [id number] here today for follow up s/p MAB. She denies any abnormal vaginal discharge, bleeding, pelvic pain or other concerns. She reports that she is continuing to bleed and notes a "smell like dead meat."  She also reports cramping that "come and go." And does not require intervention. Patient reports back pain that has occurred since her loss.  She reports taking iburpofen and tylenol with no relief. She reports it lasts 10 minutes and occurs every now and then.   Past Medical History:  Diagnosis Date   Anemia     Past Surgical History:  Procedure Laterality Date   NO PAST SURGERIES      The following portions of the patient's history were reviewed and updated as appropriate: allergies, current medications, past family history, past medical history, past social history, past surgical history and problem list.   Health Maintenance:  Normal pap and negative HRHPV on Nov 2020.  No mammogram on file d/t age.   Review of Systems:  Genito-Urinary ROS: no dysuria, trouble voiding, or hematuria Gastrointestinal ROS: negative Objective:  Vitals: BP 113/83   Pulse (!) 114   Wt 111 lb (50.3 kg)   LMP 04/18/2023   Breastfeeding Unknown   BMI 20.97 kg/m   Physical Exam: Physical Exam Constitutional:      General: She is not in acute distress.    Appearance: Normal appearance.  HENT:     Head: Normocephalic and atraumatic.  Eyes:     Conjunctiva/sclera: Conjunctivae normal.  Cardiovascular:     Rate and Rhythm: Normal rate.  Pulmonary:     Effort: Pulmonary effort is normal. No respiratory distress.  Abdominal:     Palpations: Abdomen is soft.     Tenderness: There is no abdominal tenderness.  Musculoskeletal:     Cervical back: Normal range of motion.  Neurological:     Mental Status: She is alert and oriented to person, place, and time.  Skin:    General: Skin is warm and dry.  Psychiatric:         Mood and Affect: Mood normal.        Behavior: Behavior normal.  Vitals and nursing note reviewed.      Labs and Imaging: US OB LESS THAN 14 WEEKS WITH OB TRANSVAGINAL  Result Date: 07/12/2023 CLINICAL DATA:  Follow-up 6 week and 1 day pregnancy with no fetal cardiac activity on a limited early Ob ultrasound dated 06/28/2023. EXAM: OBSTETRIC <14 WK ULTRASOUND TECHNIQUE: Transabdominal ultrasound was performed for evaluation of the gestation as well as the maternal uterus and adnexal regions. COMPARISON:  06/28/2023 and 07/11/2023 obtained elsewhere. FINDINGS: Intrauterine gestational sac: Visualized, large and misshapen with floating internal echoes. Yolk sac:  Visualized, small Embryo:  Visualized Cardiac Activity: Not visualized CRL:   4.1 mm mm   6 w 1 d Subchorionic hemorrhage:  Not visualized Maternal uterus/adnexae: Normal-appearing ovaries. No free peritoneal fluid. IMPRESSION: 1. Single intrauterine pregnancy with an estimated gestational age by fetal crown-rump length of 6 weeks and 1 day with no fetal cardiac activity. This confirms the previously suspected fetal demise at 6 weeks and 1 day on the ultrasound dated 06/28/2023. 2. No other abnormalities seen. These results will be called to the ordering clinician or representative by the Radiologist Assistant, and communication documented in the PACS or [employer]. Electronically Signed   By: Beckie Salts M.D.   On: 07/12/2023 18:37  Assessment & Plan:  27 year old MAB Follow Up Vaginal Odor   -Informed that vaginal odor is not anticipated s/p MAB. -CV ordered and patient to collect via self swab.  -Informed that results will be sent via mychart. -Instructed to use heat for intermittent back pain. Report any worsening symptoms.  -hCG level ordered.  Discussed following level to non-pregnant state. -Patient desiring pregnancy in near future. -Encouraged to take PNV with folic acid.  -Informed that she can resume conception  efforts once hCG level returns to <5. -Patient informed that pap smear is out of date. Declines today d/t bleeding. -RTO prn or in 3-6 weeks for yearly for exam.   Total face-to-face time with patient: 15 minutes   Gerrit Heck, [place] 07/31/2023 3:16 PM

## 2023-08-01 LAB — BETA HCG QUANT (REF LAB): hCG Quant: 331 m[IU]/mL

## 2023-08-01 LAB — HEMOGLOBIN AND HEMATOCRIT, BLOOD
Hematocrit: 42.2 % (ref 34.0–46.6)
Hemoglobin: 13.6 g/dL (ref 11.1–15.9)

## 2023-08-06 ENCOUNTER — Other Ambulatory Visit: Payer: Medicaid Other

## 2023-08-06 LAB — CERVICOVAGINAL ANCILLARY ONLY
Bacterial Vaginitis (gardnerella): POSITIVE — AB
Candida Glabrata: NEGATIVE
Candida Vaginitis: NEGATIVE
Chlamydia: NEGATIVE
Comment: NEGATIVE
Comment: NEGATIVE
Comment: NEGATIVE
Comment: NEGATIVE
Comment: NEGATIVE
Comment: NORMAL
Neisseria Gonorrhea: NEGATIVE
Trichomonas: NEGATIVE

## 2023-08-06 MED ORDER — METRONIDAZOLE 500 MG PO TABS: 500.0000 mg | ORAL_TABLET | Freq: Two times a day (BID) | ORAL | 0 refills | Status: DC

## 2023-08-06 NOTE — Addendum Note (Signed)
Addended by: Gerrit Heck L on: 08/06/2023 10:34 PM   Modules accepted: Orders

## 2023-08-13 ENCOUNTER — Other Ambulatory Visit: Payer: Medicaid Other

## 2023-08-14 ENCOUNTER — Other Ambulatory Visit: Payer: Medicaid Other

## 2023-10-10 NOTE — L&D Delivery Note (Signed)
 OB/GYN Faculty Practice Delivery Note  Jacqueline Mitchell is a 28 y.o. [id number] s/p SVD at [redacted]w[redacted]d. She was admitted for SOL.   ROM: Unsure of timeline for ROM given pt did not endorse ROM but membranes were ruptured on exam once pt was complete GBS Status:  Positive/-- (11/19 0411) Maximum Maternal Temperature:  Temp (24hrs), Avg:98.4 F (36.9 C), Min:98.4 F (36.9 C), Max:98.4 F (36.9 C)    Labor Progress: Patient arrived at 3.5 cm dilation and was managed expectantly. Had persistent late decels that progressed to prolonged decels after epidural. Pt was given two doses of terbutaline  due to prolonged lates.   Delivery Date/Time: 08/31/2024 at 1431 Delivery: Called to room and patient was complete on exam. Began pushing with pt. Head delivered in LOA position. Tight nuchal cord present which was reduced prior to delivery. Shoulder and body delivered in usual fashion. Infant with spontaneous cry, placed on mother's abdomen, dried and stimulated. Cord clamped x 2 after 1-minute delay, and cut by FOB Soe. Cord blood drawn. Placenta delivered spontaneously with gentle cord traction. Fundus firm with massage and Pitocin . Labia, perineum, vagina, and cervix inspected with minimal vaginal wall and perineal abrasions.   Placenta:  spontaneous, intact, 3 vessel cord Complications: Tight nuchal cord, arterial cord blood collected. Lacerations: None EBL: 80 Analgesia: epidural   Infant: APGAR (1 MIN):  8 APGAR (5 MINS):  9  Weight: 3190 g  Fairy Amy, MD [facility] - PGY-1  Attestation of Supervision of Student:  I confirm that I have verified the information documented in the resident's note and that I have also personally directly supervised the history, physical exam and all medical decision making activities.  I have verified that all services and findings are accurately documented in this student's note; and I agree with management and plan as outlined in the  documentation. I have also made any necessary editorial changes.   Barkley LITTIE Angles, MD OB Fellow 08/31/2024 3:39 PM

## 2023-11-21 ENCOUNTER — Ambulatory Visit (INDEPENDENT_AMBULATORY_CARE_PROVIDER_SITE_OTHER): Payer: Medicaid Other | Admitting: General Practice

## 2023-11-21 VITALS — BP 107/71 | HR 101 | Ht 62.0 in | Wt 116.0 lb

## 2023-11-21 DIAGNOSIS — Z3200 Encounter for pregnancy test, result unknown: Secondary | ICD-10-CM

## 2023-11-21 DIAGNOSIS — Z3480 Encounter for supervision of other normal pregnancy, unspecified trimester: Secondary | ICD-10-CM

## 2023-11-21 DIAGNOSIS — Z3201 Encounter for pregnancy test, result positive: Secondary | ICD-10-CM | POA: Diagnosis not present

## 2023-11-21 LAB — POCT URINE PREGNANCY: Preg Test, Ur: POSITIVE — AB

## 2023-11-21 MED ORDER — CVS PRENATAL GUMMY 0.18-25 MG PO CHEW
1.0000 | CHEWABLE_TABLET | Freq: Every day | ORAL | 13 refills | Status: DC
Start: 2023-11-21 — End: 2024-05-21

## 2023-11-21 NOTE — Progress Notes (Signed)
Jacqueline Mitchell presents today for UPT. She has no unusual complaints. LMP: 10-02-23    OBJECTIVE: Appears well, in no apparent distress.  OB History     Gravida  3   Para  2   Term  2   Preterm  0   AB  0   Living  2      SAB  0   IAB  0   Ectopic  0   Multiple  0   Live Births  2          Home UPT Result: In-Office UPT result: I have reviewed the patient's medical, obstetrical, social, and family histories, and medications.   ASSESSMENT: Positive pregnancy test. Very faint positive UPT x2. Beta HCG collected  PLAN Prenatal care to be completed at:

## 2023-11-22 LAB — BETA HCG QUANT (REF LAB): hCG Quant: 88 m[IU]/mL

## 2023-11-25 ENCOUNTER — Other Ambulatory Visit: Payer: Self-pay

## 2023-11-25 ENCOUNTER — Encounter (HOSPITAL_BASED_OUTPATIENT_CLINIC_OR_DEPARTMENT_OTHER): Payer: Self-pay

## 2023-11-25 ENCOUNTER — Emergency Department (HOSPITAL_BASED_OUTPATIENT_CLINIC_OR_DEPARTMENT_OTHER): Payer: Medicaid Other

## 2023-11-25 ENCOUNTER — Emergency Department (HOSPITAL_BASED_OUTPATIENT_CLINIC_OR_DEPARTMENT_OTHER)
Admission: EM | Admit: 2023-11-25 | Discharge: 2023-11-25 | Disposition: A | Discharge status: Home / Self Care | Payer: Medicaid Other | Attending: Emergency Medicine | Admitting: Emergency Medicine

## 2023-11-25 DIAGNOSIS — Z720 Tobacco use: Secondary | ICD-10-CM | POA: Insufficient documentation

## 2023-11-25 DIAGNOSIS — Z3A00 Weeks of gestation of pregnancy not specified: Secondary | ICD-10-CM | POA: Insufficient documentation

## 2023-11-25 DIAGNOSIS — O209 Hemorrhage in early pregnancy, unspecified: Secondary | ICD-10-CM | POA: Diagnosis present

## 2023-11-25 DIAGNOSIS — O3680X0 Pregnancy with inconclusive fetal viability, not applicable or unspecified: Secondary | ICD-10-CM

## 2023-11-25 LAB — CBC
HCT: 37 % (ref 36.0–46.0)
Hemoglobin: 12.4 g/dL (ref 12.0–15.0)
MCH: 29.9 pg (ref 26.0–34.0)
MCHC: 33.5 g/dL (ref 30.0–36.0)
MCV: 89.2 fL (ref 80.0–100.0)
Platelets: 292 10*3/uL (ref 150–400)
RBC: 4.15 MIL/uL (ref 3.87–5.11)
RDW: 13.2 % (ref 11.5–15.5)
WBC: 5.8 10*3/uL (ref 4.0–10.5)
nRBC: 0 % (ref 0.0–0.2)

## 2023-11-25 LAB — HCG, QUANTITATIVE, PREGNANCY: hCG, Beta Chain, Quant, S: 17 m[IU]/mL — ABNORMAL HIGH (ref <5)

## 2023-11-25 NOTE — ED Provider Notes (Signed)
 [place] EMERGENCY DEPARTMENT AT [facility] Provider Note  CSN: [id number] Arrival date & time: 11/25/23 1543  Chief Complaint(s) Vaginal Bleeding  HPI Jacqueline Mitchell is a 28 y.o. female who denies significant past medical history presenting to the emergency department vaginal bleeding.  Patient reports last menstrual cycle was late December.  Did recently have a positive pregnancy test.  Reports some lower abdominal cramping.  No lightheadedness or dizziness, no fainting.  No chest pain, no shortness of breath.  Bleeding began today.   Past Medical History Past Medical History:  Diagnosis Date   Anemia    Patient Active Problem List   Diagnosis Date Noted   Desire for pregnancy 01/17/2023   Home Medication(s) Prior to Admission medications   Medication Sig Start Date End Date Taking? Authorizing Provider  Blood Pressure Monitoring (BLOOD PRESSURE KIT) DEVI 1 Device by Does not apply route once a week. Patient not taking: Reported on 11/21/2023 06/28/23   Conan Bowens, MD  ferrous sulfate 325 (65 FE) MG tablet Take 1 tablet (325 mg total) by mouth every other day. Patient not taking: Reported on 11/21/2023 12/10/21   Warner Mccreedy, MD  metroNIDAZOLE (FLAGYL) 500 MG tablet Take 1 tablet (500 mg total) by mouth 2 (two) times daily. Patient not taking: Reported on 11/21/2023 08/06/23   Gerrit Heck, CNM  misoprostol (CYTOTEC) 200 MCG tablet Insert four tablets vaginally the night prior to your appointment Patient not taking: Reported on 11/21/2023 07/13/23   Constant, Peggy, MD  Prenatal MV & Min w/FA-DHA ([facility] PRENATAL GUMMY) 0.18-25 MG CHEW Chew 1 tablet by mouth daily. 11/21/23   Hessie Dibble, MD                                                                                                                                    Past Surgical History Past Surgical History:  Procedure Laterality Date   NO PAST SURGERIES     Family History Family History  Problem Relation Age of Onset    Diabetes Mother    Miscarriages / [place] Mother    Healthy Father     Social History Social History   Tobacco Use   Smoking status: Never   Smokeless tobacco: Former    Types: Associate Professor status: Never Used  Substance Use Topics   Alcohol use: Yes    Comment: Occasional   Drug use: Never   Allergies Patient has no known allergies.  Review of Systems Review of Systems  All other systems reviewed and are negative.   Physical Exam Vital Signs  I have reviewed the triage vital signs BP 101/68   Pulse 85   Temp 98.4 F (36.9 C) (Oral)   Resp 18   Ht 5\' 2"  (1.575 m)   Wt 52.2 kg   LMP 10/02/2023   SpO2 100%   BMI 21.03 kg/m  Physical Exam Vitals and  nursing note reviewed.  Constitutional:      General: She is not in acute distress.    Appearance: She is well-developed.  HENT:     Head: Normocephalic and atraumatic.     Mouth/Throat:     Mouth: Mucous membranes are moist.  Eyes:     Pupils: Pupils are equal, round, and reactive to light.  Cardiovascular:     Rate and Rhythm: Normal rate and regular rhythm.     Heart sounds: No murmur heard. Pulmonary:     Effort: Pulmonary effort is normal. No respiratory distress.     Breath sounds: Normal breath sounds.  Abdominal:     General: Abdomen is flat.     Palpations: Abdomen is soft.     Tenderness: There is no abdominal tenderness.  Musculoskeletal:        General: No tenderness.     Right lower leg: No edema.     Left lower leg: No edema.  Skin:    General: Skin is warm and dry.  Neurological:     General: No focal deficit present.     Mental Status: She is alert. Mental status is at baseline.  Psychiatric:        Mood and Affect: Mood normal.        Behavior: Behavior normal.     ED Results and Treatments Labs (all labs ordered are listed, but only abnormal results are displayed) Labs Reviewed  HCG, QUANTITATIVE, PREGNANCY - Abnormal; Notable for the following components:       Result Value   hCG, Beta Chain, Quant, S 17 (*)    All other components within normal limits  CBC  PREGNANCY, URINE  ABO/RH                                                                                                                          Radiology US OB LESS THAN 14 WEEKS WITH OB TRANSVAGINAL Result Date: 11/25/2023 CLINICAL DATA:  Initial evaluation for acute vaginal bleeding, pelvic cramping, early pregnancy. EXAM: OBSTETRIC <14 WK [name] AND TRANSVAGINAL OB US TECHNIQUE: Both transabdominal and transvaginal ultrasound examinations were performed for complete evaluation of the gestation as well as the maternal uterus, adnexal regions, and pelvic cul-de-sac. Transvaginal technique was performed to assess early pregnancy. COMPARISON:  None Available. FINDINGS: Intrauterine gestational sac: Negative Yolk sac:  Negative Embryo:  Negative Cardiac Activity: Negative Heart Rate: N/A Subchorionic hemorrhage:  None visualized. Maternal uterus/adnexae: Left ovary within normal limits. 1.5 x 1.4 x 1.5 cm mildly complex right ovarian cyst, likely a degenerating corpus luteal cyst. No other adnexal mass or free fluid. IMPRESSION: 1. Early pregnancy with no discrete IUP or adnexal mass identified. Finding is consistent with a pregnancy of unknown anatomic location. Differential considerations include IUP to early to visualize, recent SAB, or possibly occult ectopic pregnancy. Close clinical monitoring with serial beta HCGs and close interval follow-up ultrasound recommended as clinically warranted. 2. 1.5 cm right ovarian cyst, likely  a corpus luteal cyst. Electronically Signed   By: Rise Mu M.D.   On: 11/25/2023 18:45    Pertinent labs & imaging results that were available during my care of the patient were reviewed by me and considered in my medical decision making (see MDM for details).  Medications Ordered in ED Medications - No data to display                                                                                                                                    Procedures Procedures  (including critical care time)  Medical Decision Making / ED Course   MDM:  28 year old presenting to the emergency department with vaginal bleeding.  Patient overall quite well-appearing.  Physical exam unremarkable.  Vitals reassuring.  No hypotension, tachycardia.  Suspect likely miscarriage.  Will obtain ultrasound.  Beta hCG is quite low.  Will likely need further beta-hCG testing if nothing is visualized.  Has been following up with OB/GYN here at [facility].  She can follow-up with them if no concerning results are found. Clinical Course as of 11/25/23 2115  Wynelle Link Nov 25, 2023  2111 Ultrasound with no discrete IUP or adnexal mass.  Recommend repeat ultrasound and trending beta-hCG.  Patient is also following with [facility], recommended that she call them tomorrow for follow-up appointment for trending beta-hCG.  Suspect most likely causes miscarriage or failed pregnancy given low hCG level. Will discharge patient to home. All questions answered. Patient comfortable with plan of discharge. Return precautions discussed with patient and specified on the after visit summary.  [WS]    Clinical Course User Index [WS] Lonell Grandchild, MD      Lab Tests: -I ordered, reviewed, and interpreted labs.   The pertinent results include:   Labs Reviewed  HCG, QUANTITATIVE, PREGNANCY - Abnormal; Notable for the following components:      Result Value   hCG, Beta Chain, Quant, S 17 (*)    All other components within normal limits  CBC  PREGNANCY, URINE  ABO/RH    Notable for elevated beta HCG  Imaging Studies ordered: I ordered imaging studies including pelvis US On my interpretation imaging demonstrates no IUP and no ectopic pregnancy  I independently visualized and interpreted imaging. I agree with the radiologist interpretation   Medicines ordered and prescription drug  management: No orders of the defined types were placed in this encounter.   -I have reviewed the patients home medicines and have made adjustments as needed   Co morbidities that complicate the patient evaluation  Past Medical History:  Diagnosis Date   Anemia       Dispostion: Disposition decision including need for hospitalization was considered, and patient discharged from emergency department.    Final Clinical Impression(s) / ED Diagnoses Final diagnoses:  Pregnancy of unknown anatomic location     This chart was dictated using voice recognition software.  Despite best efforts  to proofread,  errors can occur which can change the documentation meaning.    Lonell Grandchild, MD 11/25/23 2115

## 2023-11-25 NOTE — ED Triage Notes (Signed)
 Patient presents with vaginal bleeding that started today. Reports she is [redacted] weeks pregnant. Reports bright red blood with small clots. Denies saturating an entire pad. Denies dizziness, lightheadedness.

## 2023-11-25 NOTE — Discharge Instructions (Signed)
 We evaluated you for your vaginal bleeding in pregnancy.  Your ultrasound did not show any pregnancy inside your uterus, and did not show any other dangerous findings such as an ectopic pregnancy (pregnancy on the ovaries).  It is possible that you have had a miscarriage.  Please follow-up with your OB/GYN doctor for repeat laboratory testing and a repeat ultrasound.  If you develop any new or worsening symptoms such as lightheadedness or dizziness, fainting, severe abdominal pain, or any other new symptoms, please return to the emergency department.

## 2023-11-26 ENCOUNTER — Ambulatory Visit: Payer: Medicaid Other

## 2023-11-27 ENCOUNTER — Ambulatory Visit (INDEPENDENT_AMBULATORY_CARE_PROVIDER_SITE_OTHER): Payer: Medicaid Other

## 2023-11-27 VITALS — BP 109/57 | HR 76

## 2023-11-27 DIAGNOSIS — O039 Complete or unspecified spontaneous abortion without complication: Secondary | ICD-10-CM

## 2023-11-27 DIAGNOSIS — Z3A01 Less than 8 weeks gestation of pregnancy: Secondary | ICD-10-CM | POA: Diagnosis not present

## 2023-11-27 NOTE — Progress Notes (Signed)
 Pt presents for STAT beta HCG. Pt reports heavy bleeding two days ago when she went to ED. Pt has previously had a miscarriage and reports same symptoms. Passed tissue two days ago as well. Now only with light bleeding, no pain. Pt is requesting referral to fertility specialist for two miscarriages. Pt also will be getting repeat ultrasound done per ED recs. Will get STAT hcg results back today, and will let pt know recommendations after provider review.

## 2023-11-28 LAB — BETA HCG QUANT (REF LAB): hCG Quant: 5 m[IU]/mL

## 2023-12-31 ENCOUNTER — Encounter: Payer: Self-pay | Admitting: Obstetrics and Gynecology

## 2023-12-31 ENCOUNTER — Ambulatory Visit: Payer: Medicaid Other | Admitting: Obstetrics and Gynecology

## 2023-12-31 VITALS — BP 98/64 | HR 78 | Ht 61.0 in | Wt 117.0 lb

## 2023-12-31 DIAGNOSIS — Z3009 Encounter for other general counseling and advice on contraception: Secondary | ICD-10-CM | POA: Diagnosis not present

## 2023-12-31 DIAGNOSIS — Z3A01 Less than 8 weeks gestation of pregnancy: Secondary | ICD-10-CM

## 2023-12-31 DIAGNOSIS — Z3201 Encounter for pregnancy test, result positive: Secondary | ICD-10-CM | POA: Diagnosis not present

## 2023-12-31 DIAGNOSIS — O039 Complete or unspecified spontaneous abortion without complication: Secondary | ICD-10-CM

## 2023-12-31 DIAGNOSIS — Z5189 Encounter for other specified aftercare: Secondary | ICD-10-CM | POA: Diagnosis not present

## 2023-12-31 NOTE — Progress Notes (Unsigned)
   GYNECOLOGY PROGRESS NOTE  History:  28 y.o. [id number] presents to [facility]  office today for SAB follow up.  LMP 10/02/23, seen in ED 2/16 for vaginal bleeding.  LMP December, had miscarriage 2/16, bleeding 2/18, no bleeding since then  Desires contraception   The following portions of the patient's history were reviewed and updated as appropriate: allergies, current medications, past family history, past medical history, past social history, past surgical history and problem list. Last pap smear on 2020  Health Maintenance Due  Topic Date Due   HPV VACCINES (1 - 3-dose series) Never done   Cervical Cancer Screening (Pap smear)  08/12/2022   INFLUENZA VACCINE  05/10/2023   COVID-19 Vaccine (1 - 2024-25 season) Never done     Review of Systems:  Pertinent items are noted in HPI.   Objective:  Physical Exam Blood pressure 98/64, pulse 78, height 5\' 1"  (1.549 m), weight 117 lb (53.1 kg), last menstrual period 10/06/2023, unknown if currently breastfeeding. VS reviewed, nursing note reviewed,  Constitutional: well developed, well nourished, no distress HEENT: normocephalic CV: normal rate Pulm/chest wall: normal effort Breast Exam: deferred Abdomen: soft Neuro: alert and oriented  Skin: warm, dry Psych: affect normal Pelvic exam: deferred   Assessment & Plan:  1. Follow-up visit after miscarriage U/s 2/16 no definitive IUP, neg yolk sac and embryo. 2/18 stat HCG 5. UPT positive today x 2, no period or bleeding since 2/18   Albertine Grates, FNP

## 2023-12-31 NOTE — Progress Notes (Unsigned)
 Has had sex with out BCM. Used Ocs in the past. Wants to start these. Denies problems today.

## 2024-01-01 LAB — POCT URINE PREGNANCY: Preg Test, Ur: POSITIVE — AB

## 2024-01-02 LAB — TSH RFX ON ABNORMAL TO FREE T4: TSH: 1.81 u[IU]/mL (ref 0.450–4.500)

## 2024-01-02 LAB — BETA HCG QUANT (REF LAB): hCG Quant: 52 m[IU]/mL

## 2024-01-02 LAB — LUPUS ANTICOAGULANT PANEL
Dilute Viper Venom Time: 34.3 s (ref 0.0–47.0)
PTT Lupus Anticoagulant: 27.5 s (ref 0.0–43.5)

## 2024-01-03 LAB — CARDIOLIPIN ANTIBODIES, IGM+IGG
Anticardiolipin IgG: <9 GPL U/mL (ref 0–14)
Anticardiolipin IgM: <9 [MPL'U]/mL (ref 0–12)

## 2024-01-14 ENCOUNTER — Ambulatory Visit: Admitting: *Deleted

## 2024-01-14 VITALS — BP 98/64 | HR 90

## 2024-01-14 DIAGNOSIS — O021 Missed abortion: Secondary | ICD-10-CM

## 2024-01-14 DIAGNOSIS — O039 Complete or unspecified spontaneous abortion without complication: Secondary | ICD-10-CM

## 2024-01-14 DIAGNOSIS — Z3A01 Less than 8 weeks gestation of pregnancy: Secondary | ICD-10-CM

## 2024-01-14 NOTE — Progress Notes (Signed)
 Jacqueline Mitchell presents for non STAT bHCG for follow up on recent SAB. Denies any further VB or abdominal pain. Appears well and in NAD. Condolences offered.

## 2024-01-15 LAB — BETA HCG QUANT (REF LAB): hCG Quant: 20081 m[IU]/mL

## 2024-01-30 ENCOUNTER — Ambulatory Visit: Admitting: *Deleted

## 2024-01-30 ENCOUNTER — Other Ambulatory Visit (INDEPENDENT_AMBULATORY_CARE_PROVIDER_SITE_OTHER): Payer: Self-pay

## 2024-01-30 VITALS — BP 98/64 | HR 94 | Ht 62.0 in | Wt 115.5 lb

## 2024-01-30 DIAGNOSIS — O3680X0 Pregnancy with inconclusive fetal viability, not applicable or unspecified: Secondary | ICD-10-CM

## 2024-01-30 DIAGNOSIS — Z3480 Encounter for supervision of other normal pregnancy, unspecified trimester: Secondary | ICD-10-CM | POA: Insufficient documentation

## 2024-01-30 DIAGNOSIS — Z3A01 Less than 8 weeks gestation of pregnancy: Secondary | ICD-10-CM

## 2024-01-30 DIAGNOSIS — Z3481 Encounter for supervision of other normal pregnancy, first trimester: Secondary | ICD-10-CM | POA: Diagnosis not present

## 2024-01-30 DIAGNOSIS — Z1339 Encounter for screening examination for other mental health and behavioral disorders: Secondary | ICD-10-CM | POA: Diagnosis not present

## 2024-01-30 MED ORDER — BLOOD PRESSURE KIT DEVI: 1.0000 | 0 refills | Status: DC

## 2024-01-30 NOTE — Patient Instructions (Signed)

## 2024-01-30 NOTE — Progress Notes (Signed)
 New OB Intake  I connected with Jacqueline Mitchell  on 01/30/24 at  1:10 PM EDT by In Person Visit and verified that I am speaking with the correct person using two identifiers. Nurse is located at [facility] and pt is located at [place].  I discussed the limitations, risks, security and privacy concerns of performing an evaluation and management service by telephone and the availability of in person appointments. I also discussed with the patient that there may be a patient responsible charge related to this service. The patient expressed understanding and agreed to proceed.  I explained I am completing New OB Intake today. We discussed EDD of Not found.. Pt is [id number]. I reviewed her allergies, medications and Medical/Surgical/OB history.    There are no active problems to display for this patient.    Concerns addressed today  Delivery Plans Plans to deliver at [facility]. Discussed the nature of our practice with multiple providers including residents and students. Due to the size of the practice, the delivering provider may not be the same as those providing prenatal care.   Patient is not interested in water birth.  MyChart/Babyscripts MyChart access verified. I explained pt will have some visits in office and some virtually. Babyscripts instructions given and order placed. Patient verifies receipt of registration text/e-mail. Account successfully created and app downloaded. If patient is a candidate for Optimized scheduling, add to sticky note.   Blood Pressure Cuff/Weight Scale Blood pressure cuff ordered for patient to pick-up from [employer]. Explained after first prenatal appt pt will check weekly and document in Babyscripts. Patient does not have weight scale; patient may purchase if they desire to track weight weekly in Babyscripts.  Anatomy US  Explained first scheduled US  will be around 19 weeks. Anatomy US  scheduled for TBD at TBD.  Is patient a candidate for Babyscripts  Optimization? No, due to Risk Factors/ Language Barrier   First visit review I reviewed new OB appt with patient. Explained pt will be seen by Susi Eric, NP at first visit. Discussed [place] genetic screening with patient. Requests Panorama. Routine prenatal labs  OB Urine only collected at today's visit. Initial OB labs deferred to New OB appt.    Last Pap Diagnosis  Date Value Ref Range Status  08/13/2019   Final   - Negative for intraepithelial lesion or malignancy (NILM)    Donette Furlong, RN 01/30/2024  1:18 PM

## 2024-01-31 ENCOUNTER — Encounter: Payer: Self-pay | Admitting: Obstetrics and Gynecology

## 2024-02-01 LAB — URINE CULTURE, OB REFLEX

## 2024-02-01 LAB — CULTURE, OB URINE

## 2024-02-06 ENCOUNTER — Encounter: Payer: Self-pay | Admitting: Obstetrics and Gynecology

## 2024-02-27 ENCOUNTER — Other Ambulatory Visit (HOSPITAL_COMMUNITY)
Admission: RE | Admit: 2024-02-27 | Discharge: 2024-02-27 | Disposition: A | Discharge status: Home / Self Care | Source: Ambulatory Visit | Attending: Obstetrics and Gynecology | Admitting: Obstetrics and Gynecology

## 2024-02-27 ENCOUNTER — Ambulatory Visit: Admitting: Obstetrics and Gynecology

## 2024-02-27 ENCOUNTER — Encounter: Payer: Self-pay | Admitting: Obstetrics and Gynecology

## 2024-02-27 VITALS — BP 112/71 | HR 93 | Wt 115.2 lb

## 2024-02-27 DIAGNOSIS — Z3A11 11 weeks gestation of pregnancy: Secondary | ICD-10-CM

## 2024-02-27 DIAGNOSIS — Z3480 Encounter for supervision of other normal pregnancy, unspecified trimester: Secondary | ICD-10-CM | POA: Diagnosis present

## 2024-02-27 DIAGNOSIS — Z3481 Encounter for supervision of other normal pregnancy, first trimester: Secondary | ICD-10-CM | POA: Insufficient documentation

## 2024-02-27 DIAGNOSIS — F17220 Nicotine dependence, chewing tobacco, uncomplicated: Secondary | ICD-10-CM | POA: Diagnosis not present

## 2024-02-27 NOTE — Progress Notes (Signed)
 Pt presents for NOB visit. No concerns.

## 2024-02-27 NOTE — Progress Notes (Signed)
 INITIAL PRENATAL VISIT  Subjective:   Jacqueline Mitchell is being seen today for her first obstetrical visit. She is at [redacted]w[redacted]d gestation by ultrasound Her obstetrical history is significant for none. Relationship with FOB: spouse, living together. Patient does intend to breast feed. Pregnancy history fully reviewed.  Patient reports no complaints.  Indications for ASA therapy (per uptodate)  Two or more of the following: Nulliparity No Obesity (body mass index >30 kg/m2) No Family history of preeclampsia in mother or sister No Age >=35 years No Sociodemographic characteristics (African American race, low socioeconomic level) No Personal risk factors (eg, previous pregnancy with low birth weight or small for gestational age infant, previous adverse pregnancy outcome [eg, stillbirth], interval >10 years between pregnancies) No   Objective:    Obstetric History OB History  Gravida Para Term Preterm AB Living  5 2 2  0 2 2  SAB IAB Ectopic Multiple Live Births  2 0 0 0 2    # Outcome Date GA Lbr Len/2nd Weight Sex Type Anes PTL Lv  5 Current           4 SAB 11/27/23     SAB     3 SAB 07/13/23 [redacted]w[redacted]d    SAB     2 Term 12/08/21 [redacted]w[redacted]d 07:52 / 00:04 7 lb 10.2 oz (3.464 kg) F Vag-Spont EPI  LIV  1 Term 06/20/19 [redacted]w[redacted]d 05:40 / 04:37 7 lb 7.4 oz (3.385 kg) F Vag-Vacuum EPI  LIV    Past Medical History:  Diagnosis Date   Anemia     Past Surgical History:  Procedure Laterality Date   NO PAST SURGERIES      Current Outpatient Medications on File Prior to Visit  Medication Sig Dispense Refill   Blood Pressure Monitoring (BLOOD PRESSURE KIT) DEVI 1 Device by Does not apply route once a week. 1 each 0   Prenatal Vit-Fe Fumarate-FA (PRENATAL VITAMIN PO) Take 1 tablet by mouth daily.     ferrous sulfate  325 (65 FE) MG tablet Take 1 tablet (325 mg total) by mouth every other day. (Patient not taking: Reported on 02/27/2024) 60 tablet 0   metroNIDAZOLE  (FLAGYL ) 500 MG tablet Take 1 tablet (500 mg  total) by mouth 2 (two) times daily. (Patient not taking: Reported on 02/27/2024) 14 tablet 0   misoprostol  (CYTOTEC ) 200 MCG tablet Insert four tablets vaginally the night prior to your appointment (Patient not taking: Reported on 02/27/2024) 4 tablet 1   Prenatal MV & Min w/FA-DHA ([facility] PRENATAL GUMMY) 0.18-25 MG CHEW Chew 1 tablet by mouth daily. (Patient not taking: Reported on 02/27/2024) 30 tablet 13   No current facility-administered medications on file prior to visit.    No Known Allergies  Social History:  reports that she has never smoked. Her smokeless tobacco use includes chew. She reports that she does not currently use alcohol. She reports that she does not use drugs.  Family History  Problem Relation Age of Onset   Diabetes Mother    Miscarriages / [place] Mother    Healthy Father     The following portions of the patient's history were reviewed and updated as appropriate: allergies, current medications, past family history, past medical history, past social history, past surgical history and problem list.  Review of Systems Review of Systems  All other systems reviewed and are negative.     Physical Exam:  BP 112/71   Pulse 93   Wt 115 lb 3.2 oz (52.3 kg)   LMP  (  LMP Unknown)   BMI 21.07 kg/m  CONSTITUTIONAL: Well-developed, well-nourished female in no acute distress.  HENT:  Normocephalic, atraumatic.   EYES: Conjunctivae normal. NECK: Normal range of motion SKIN: Skin is warm and dry. MUSCULOSKELETAL: Normal range of motion NEUROLOGIC: Alert and oriented  PSYCHIATRIC: Normal mood and affect. Normal behavior.  RESPIRATORY: normal effort ABDOMEN: Soft PELVIC:Pelvic: normal appearing vulva with no masses, tenderness or lesions  VAGINA: normal appearing vagina with normal color and discharge, no lesions  CERVIX: normal appearing cervix without discharge or lesions, no CMT  Thin prep pap is done   Extremities:  No swelling or varicosities noted   Fetal  Heart Rate (bpm): 173   Movement: Absent       Assessment:    Pregnancy: [id number]  1. Supervision of other normal pregnancy, antepartum (Primary) BP and FHR normal Pap today  - Cytology - PAP( [place]) - Cervicovaginal ancillary only( [place]) - CBC/D/Plt+RPR+Rh+ABO+RubIgG... - PANORAMA PRENATAL TEST - HgB A1c  2. [redacted] weeks gestation of pregnancy  - Cervicovaginal ancillary only( [place]) - CBC/D/Plt+RPR+Rh+ABO+RubIgG... - PANORAMA PRENATAL TEST - HgB A1c  3. Chewing tobacco dependence discussed affect during pregnancy, encouraged cessation   Plan:     Initial labs drawn. Prenatal vitamins. Problem list reviewed and updated. Reviewed in detail the nature of the practice with collaborative care between  Genetic screening discussed: NIPS/First trimester screen/Quad/AFP ordered. Role of ultrasound in pregnancy discussed; Anatomy US : ordered. Discussed clinic routines, schedule of care and testing, genetic screening options, involvement of students and residents under the direct supervision of APPs and doctors and presence of female providers. Pt verbalized understanding.  Return in 4 weeks for routine prenatal, schedule anatomy scan  [name], FNP ````````````````````````````````````````````````````````````````````````````````````````````````````````

## 2024-02-28 ENCOUNTER — Ambulatory Visit: Payer: Self-pay | Admitting: Obstetrics and Gynecology

## 2024-02-28 DIAGNOSIS — Z3480 Encounter for supervision of other normal pregnancy, unspecified trimester: Secondary | ICD-10-CM

## 2024-02-28 LAB — CBC/D/PLT+RPR+RH+ABO+RUBIGG...
Antibody Screen: NEGATIVE
Basophils Absolute: 0.1 10*3/uL (ref 0.0–0.2)
Basos: 1 %
EOS (ABSOLUTE): 0.1 10*3/uL (ref 0.0–0.4)
Eos: 1 %
HCV Ab: NONREACTIVE
HIV Screen 4th Generation wRfx: NONREACTIVE
Hematocrit: 35.2 % (ref 34.0–46.6)
Hemoglobin: 11.6 g/dL (ref 11.1–15.9)
Hepatitis B Surface Ag: NEGATIVE
Immature Grans (Abs): 0 10*3/uL (ref 0.0–0.1)
Immature Granulocytes: 1 %
Lymphocytes Absolute: 1.9 10*3/uL (ref 0.7–3.1)
Lymphs: 25 %
MCH: 29.1 pg (ref 26.6–33.0)
MCHC: 33 g/dL (ref 31.5–35.7)
MCV: 88 fL (ref 79–97)
Monocytes Absolute: 0.5 10*3/uL (ref 0.1–0.9)
Monocytes: 7 %
Neutrophils Absolute: 5.2 10*3/uL (ref 1.4–7.0)
Neutrophils: 65 %
Platelets: 329 10*3/uL (ref 150–450)
RBC: 3.98 x10E6/uL (ref 3.77–5.28)
RDW: 12.5 % (ref 11.7–15.4)
RPR Ser Ql: NONREACTIVE
Rh Factor: POSITIVE
Rubella Antibodies, IGG: 3.27 {index} (ref >0.99)
WBC: 7.8 10*3/uL (ref 3.4–10.8)

## 2024-02-28 LAB — CERVICOVAGINAL ANCILLARY ONLY
Chlamydia: NEGATIVE
Comment: NEGATIVE
Comment: NEGATIVE
Comment: NORMAL
Neisseria Gonorrhea: NEGATIVE
Trichomonas: NEGATIVE

## 2024-02-28 LAB — HCV INTERPRETATION

## 2024-02-28 LAB — HEMOGLOBIN A1C
Est. average glucose Bld gHb Est-mCnc: 100 mg/dL
Hgb A1c MFr Bld: 5.1 % (ref 4.8–5.6)

## 2024-03-03 LAB — PANORAMA PRENATAL TEST FULL PANEL:PANORAMA TEST PLUS 5 ADDITIONAL MICRODELETIONS: FETAL FRACTION: 8.3

## 2024-03-05 ENCOUNTER — Encounter: Admitting: Obstetrics and Gynecology

## 2024-03-05 LAB — CYTOLOGY - PAP: Diagnosis: NEGATIVE

## 2024-03-26 ENCOUNTER — Encounter: Payer: Self-pay | Admitting: Obstetrics and Gynecology

## 2024-03-26 ENCOUNTER — Ambulatory Visit (INDEPENDENT_AMBULATORY_CARE_PROVIDER_SITE_OTHER): Admitting: Obstetrics and Gynecology

## 2024-03-26 VITALS — BP 104/69 | HR 109 | Wt 118.6 lb

## 2024-03-26 DIAGNOSIS — Z3480 Encounter for supervision of other normal pregnancy, unspecified trimester: Secondary | ICD-10-CM

## 2024-03-26 DIAGNOSIS — Z3A15 15 weeks gestation of pregnancy: Secondary | ICD-10-CM

## 2024-03-26 NOTE — Progress Notes (Signed)
 Pt states she is just more tired than usual.   No other concerns at this time.

## 2024-03-26 NOTE — Progress Notes (Signed)
   PRENATAL VISIT NOTE  Subjective:  Jacqueline Mitchell is a 28 y.o. [id number] at [redacted]w[redacted]d being seen today for ongoing prenatal care.  She is currently monitored for the following issues for this low-risk pregnancy and has Supervision of other normal pregnancy, antepartum and Chewing tobacco dependence on their problem list.  Patient reports  .  Contractions: Not present. Vag. Bleeding: None.  Movement: Present. Denies leaking of fluid.   The following portions of the patient's history were reviewed and updated as appropriate: allergies, current medications, past family history, past medical history, past social history, past surgical history and problem list.   Objective:    Vitals:   03/26/24 1327  BP: 104/69  Pulse: (!) 109  Weight: 118 lb 9.6 oz (53.8 kg)    Fetal Status:  Fetal Heart Rate (bpm): 154   Movement: Present    General: Alert, oriented and cooperative. Patient is in no acute distress.  Skin: Skin is warm and dry. No rash noted.   Cardiovascular: Normal heart rate noted  Respiratory: Normal respiratory effort, no problems with respiration noted  Abdomen: Soft, gravid, appropriate for gestational age.  Pain/Pressure: Absent     Pelvic: Cervical exam deferred        Extremities: Normal range of motion.  Edema: None  Mental Status: Normal mood and affect. Normal behavior. Normal judgment and thought content.   Assessment and Plan:  Pregnancy: [id number] at [redacted]w[redacted]d 1. Supervision of other normal pregnancy, antepartum (Primary) BP and FHR normal Doing well,starting to feel movement   - AFP, Serum, Open Spina Bifida  2. [redacted] weeks gestation of pregnancy AFP today  Discussed hydration, limiting amount of soda, protein intake   Preterm labor symptoms and general obstetric precautions including but not limited to vaginal bleeding, contractions, leaking of fluid and fetal movement were reviewed in detail with the patient. Please refer to After Visit Summary for other counseling  recommendations.   Return in about 4 weeks (around 04/23/2024) for OB VISIT (MD or APP).   Susi Eric, FNP

## 2024-03-28 ENCOUNTER — Ambulatory Visit: Payer: Self-pay | Admitting: Obstetrics and Gynecology

## 2024-03-28 LAB — AFP, SERUM, OPEN SPINA BIFIDA
AFP MoM: 1.93
AFP Value: 65.7 ng/mL
Gest. Age on Collection Date: 15 wk
Maternal Age At EDD: 26.5 a
OSBR Risk 1 IN: 943
Test Results:: NEGATIVE
Weight: 118 [lb_av]

## 2024-04-23 ENCOUNTER — Ambulatory Visit: Admitting: Obstetrics and Gynecology

## 2024-04-23 ENCOUNTER — Encounter: Payer: Self-pay | Admitting: Obstetrics and Gynecology

## 2024-04-23 VITALS — BP 98/59 | HR 88 | Wt 121.4 lb

## 2024-04-23 DIAGNOSIS — Z3A19 19 weeks gestation of pregnancy: Secondary | ICD-10-CM

## 2024-04-23 DIAGNOSIS — F17220 Nicotine dependence, chewing tobacco, uncomplicated: Secondary | ICD-10-CM

## 2024-04-23 DIAGNOSIS — Z3480 Encounter for supervision of other normal pregnancy, unspecified trimester: Secondary | ICD-10-CM

## 2024-04-23 NOTE — Progress Notes (Signed)
   PRENATAL VISIT NOTE  Subjective:  Jacqueline Mitchell is a 28 y.o. [name] at [redacted]w[redacted]d being seen today for ongoing prenatal care.  She is currently monitored for the following issues for this low-risk pregnancy and has Supervision of other normal pregnancy, antepartum and Chewing tobacco dependence on their problem list.  Patient reports no complaints.  Contractions: Not present. Vag. Bleeding: None.  Movement: Present. Denies leaking of fluid.   The following portions of the patient's history were reviewed and updated as appropriate: allergies, current medications, past family history, past medical history, past social history, past surgical history and problem list.   Objective:    Vitals:   04/23/24 1326 04/23/24 1332  BP: (!) 87/62 (!) 98/59  Pulse: 94 88  Weight: 121 lb 6.4 oz (55.1 kg)     Fetal Status:  Fetal Heart Rate (bpm): 143   Movement: Present    General: Alert, oriented and cooperative. Patient is in no acute distress.  Skin: Skin is warm and dry. No rash noted.   Cardiovascular: Normal heart rate noted  Respiratory: Normal respiratory effort, no problems with respiration noted  Abdomen: Soft, gravid, appropriate for gestational age.  Pain/Pressure: Absent     Pelvic: Cervical exam deferred        Extremities: Normal range of motion.  Edema: None  Mental Status: Normal mood and affect. Normal behavior. Normal judgment and thought content.   Assessment and Plan:  Pregnancy: [name] at [redacted]w[redacted]d 1. Supervision of other normal pregnancy, antepartum (Primary) BP and FHR normal Doing well, feeling regular movement    2. Chewing tobacco dependence Encouraged cessation, discussed other options for habit  3. [redacted] weeks gestation of pregnancy Anatomy scan 7/30  Preterm labor symptoms and general obstetric precautions including but not limited to vaginal bleeding, contractions, leaking of fluid and fetal movement were reviewed in detail with the patient. Please refer to After Visit  Summary for other counseling recommendations.   Return in about 4 weeks (around 05/21/2024) for OB VISIT (MD or APP).  Future Appointments  Date Time Provider Department Center  05/07/2024  2:00 PM [facility] PROVIDER 1 [facility]  05/07/2024  2:30 PM [facility] US4 [facility]    Nidia Daring, FNP

## 2024-04-23 NOTE — Progress Notes (Signed)
Pt states everything is going well

## 2024-05-07 ENCOUNTER — Other Ambulatory Visit: Payer: Self-pay | Admitting: *Deleted

## 2024-05-07 ENCOUNTER — Ambulatory Visit (HOSPITAL_BASED_OUTPATIENT_CLINIC_OR_DEPARTMENT_OTHER)

## 2024-05-07 ENCOUNTER — Ambulatory Visit: Attending: Obstetrics and Gynecology | Admitting: Obstetrics and Gynecology

## 2024-05-07 VITALS — BP 100/53 | HR 93

## 2024-05-07 DIAGNOSIS — O09892 Supervision of other high risk pregnancies, second trimester: Secondary | ICD-10-CM

## 2024-05-07 DIAGNOSIS — N96 Recurrent pregnancy loss: Secondary | ICD-10-CM | POA: Insufficient documentation

## 2024-05-07 DIAGNOSIS — F17220 Nicotine dependence, chewing tobacco, uncomplicated: Secondary | ICD-10-CM | POA: Diagnosis not present

## 2024-05-07 DIAGNOSIS — O99332 Smoking (tobacco) complicating pregnancy, second trimester: Secondary | ICD-10-CM | POA: Insufficient documentation

## 2024-05-07 DIAGNOSIS — Z3A21 21 weeks gestation of pregnancy: Secondary | ICD-10-CM | POA: Diagnosis not present

## 2024-05-07 DIAGNOSIS — Z3480 Encounter for supervision of other normal pregnancy, unspecified trimester: Secondary | ICD-10-CM

## 2024-05-07 DIAGNOSIS — R9389 Abnormal findings on diagnostic imaging of other specified body structures: Secondary | ICD-10-CM | POA: Diagnosis not present

## 2024-05-07 DIAGNOSIS — Z36 Encounter for antenatal screening for chromosomal anomalies: Secondary | ICD-10-CM | POA: Insufficient documentation

## 2024-05-07 DIAGNOSIS — O99012 Anemia complicating pregnancy, second trimester: Secondary | ICD-10-CM

## 2024-05-07 DIAGNOSIS — Z3689 Encounter for other specified antenatal screening: Secondary | ICD-10-CM | POA: Diagnosis not present

## 2024-05-07 NOTE — Progress Notes (Signed)
 Maternal-Fetal Medicine Consultation Name: Jacqueline Mitchell MRN: [id number]  G5 E7977 at 21-5 gestation.  Patient is here for fetal anatomy scan.  On cell free fetal DNA screening, the risks of fetal aneuploidies are not increased.  MSAFP screening showed low risk for open neural tube defects.  Obstetrical history significant for 2 term vaginal deliveries.  Ultrasound We performed a fetal anatomical survey.  Amniotic fluid is normal and good fetal activity seen.  Fetal biometry is consistent with previously established dates.  No markers of aneuploidy cells seen. I suspect persistent right umbilical vein. Ultrasound evaluation raises a suspicion of mild scoliosis at T6 to T8 levels.  No evidence of hemivertebra or spina bifida.  Intracranial structures appear normal.  I counseled the patient on the following:  Persistent right umbilical vein (PRUV) Normally, the right umbilical vein disappears early in gestation. The left umbilical vein joins the portal vein.  In this intrahepatic variant, the right umbilical vein joins the right portal vein at the sinus venosus and continues to the ductus venosus to drain into the inferior vena cava.     The prevalence is 1 and 786 births.  Causes are unknown but folate deficiency in the first trimester and obstruction of left umbilical vein or teratogens have been implicated. I explained the findings and reassured her that in the absence of other cardiac anomalies, we should expect good neonatal outcomes. In the absence of other anomalies and with good fetal growth, delivery at [redacted] weeks gestation is reasonable.   I explained the finding of scoliosis and reassured her that there are no other spinal defects seen.  We will reevaluate the spine at following visits.  Recommendations - Appointment was made for her to return in 5 weeks for fetal growth assessment and completion of fetal anatomy. - Serial fetal growth assessments till delivery. - Consider delivery at [redacted]  weeks gestation.    Consultation including face-to-face (more than 50%) counseling 30 minutes.

## 2024-05-21 ENCOUNTER — Other Ambulatory Visit (HOSPITAL_COMMUNITY)
Admission: RE | Admit: 2024-05-21 | Discharge: 2024-05-21 | Disposition: A | Discharge status: Home / Self Care | Source: Ambulatory Visit | Attending: Obstetrics and Gynecology | Admitting: Obstetrics and Gynecology

## 2024-05-21 ENCOUNTER — Ambulatory Visit: Admitting: Obstetrics and Gynecology

## 2024-05-21 VITALS — BP 96/61 | HR 105 | Wt 126.5 lb

## 2024-05-21 DIAGNOSIS — O26892 Other specified pregnancy related conditions, second trimester: Secondary | ICD-10-CM | POA: Insufficient documentation

## 2024-05-21 DIAGNOSIS — N898 Other specified noninflammatory disorders of vagina: Secondary | ICD-10-CM | POA: Diagnosis not present

## 2024-05-21 DIAGNOSIS — F17220 Nicotine dependence, chewing tobacco, uncomplicated: Secondary | ICD-10-CM | POA: Diagnosis not present

## 2024-05-21 DIAGNOSIS — Z3480 Encounter for supervision of other normal pregnancy, unspecified trimester: Secondary | ICD-10-CM

## 2024-05-21 DIAGNOSIS — Z3A23 23 weeks gestation of pregnancy: Secondary | ICD-10-CM | POA: Diagnosis not present

## 2024-05-21 NOTE — Progress Notes (Signed)
   PRENATAL VISIT NOTE  Subjective:  Jacqueline Mitchell is a 28 y.o. [name] at [redacted]w[redacted]d being seen today for ongoing prenatal care.  She is currently monitored for the following issues for this low-risk pregnancy and has Supervision of other normal pregnancy, antepartum and Chewing tobacco dependence on their problem list.  Patient doing well with no acute concerns today. She reports no complaints.  Contractions: Not present. Vag. Bleeding: None.  Movement: Present. Denies leaking of fluid.   The following portions of the patient's history were reviewed and updated as appropriate: allergies, current medications, past family history, past medical history, past social history, past surgical history and problem list. Problem list updated.  Objective:   Vitals:   05/21/24 1317  BP: 96/61  Pulse: (!) 105  Weight: 126 lb 8 oz (57.4 kg)    Fetal Status: Fetal Heart Rate (bpm): 144 Fundal Height: 24 cm Movement: Present     General:  Alert, oriented and cooperative. Patient is in no acute distress.  Skin: Skin is warm and dry. No rash noted.   Cardiovascular: Normal heart rate noted  Respiratory: Normal respiratory effort, no problems with respiration noted  Abdomen: Soft, gravid, appropriate for gestational age.  Pain/Pressure: Absent     Pelvic: Cervical exam deferred        Extremities: Normal range of motion.  Edema: None  Mental Status:  Normal mood and affect. Normal behavior. Normal judgment and thought content.   Assessment and Plan:  Pregnancy: [name] at [redacted]w[redacted]d  1. [redacted] weeks gestation of pregnancy (Primary)   2. Supervision of other normal pregnancy, antepartum Continue routine prenatal care  3. Chewing tobacco dependence Discussed cessation with patient.  Gave alternatives such as gum, toothpicks and straws. Discussed increase risk of oral cancer. Strong cultural component of usage. Pt advised to not use at next visit during glucose test  4. Vaginal discharge during pregnancy in  second trimester Self swab taken - Cervicovaginal ancillary only  Preterm labor symptoms and general obstetric precautions including but not limited to vaginal bleeding, contractions, leaking of fluid and fetal movement were reviewed in detail with the patient.  Please refer to After Visit Summary for other counseling recommendations.   Return in about 4 weeks (around 06/18/2024) for [name], in person, 2 hr GTT, 3rd trim labs.   Jerilynn Buddle, MD Faculty Attending Center for [facility]

## 2024-05-21 NOTE — Progress Notes (Signed)
 Pt presents for [name]. Has concerns for yeast infection. Tobacco use. Burning and itching ~ 2 weeks. No other questions or concerns.

## 2024-05-22 LAB — CERVICOVAGINAL ANCILLARY ONLY
Bacterial Vaginitis (gardnerella): POSITIVE — AB
Candida Glabrata: NEGATIVE
Candida Vaginitis: POSITIVE — AB
Comment: NEGATIVE
Comment: NEGATIVE
Comment: NEGATIVE
Comment: NEGATIVE
Trichomonas: NEGATIVE

## 2024-05-27 ENCOUNTER — Ambulatory Visit: Payer: Self-pay | Admitting: Obstetrics and Gynecology

## 2024-05-27 DIAGNOSIS — B379 Candidiasis, unspecified: Secondary | ICD-10-CM

## 2024-05-27 DIAGNOSIS — B9689 Other specified bacterial agents as the cause of diseases classified elsewhere: Secondary | ICD-10-CM

## 2024-05-27 MED ORDER — TERCONAZOLE 0.8 % VA CREA: 1.0000 | TOPICAL_CREAM | Freq: Every day | VAGINAL | 0 refills | Status: DC

## 2024-05-27 MED ORDER — METRONIDAZOLE 500 MG PO TABS: 500.0000 mg | ORAL_TABLET | Freq: Two times a day (BID) | ORAL | 0 refills | Status: DC

## 2024-05-30 DIAGNOSIS — O35BXX0 Maternal care for other (suspected) fetal abnormality and damage, fetal cardiac anomalies, not applicable or unspecified: Secondary | ICD-10-CM | POA: Insufficient documentation

## 2024-06-13 ENCOUNTER — Ambulatory Visit: Admitting: Obstetrics and Gynecology

## 2024-06-13 ENCOUNTER — Ambulatory Visit: Attending: Obstetrics and Gynecology

## 2024-06-13 ENCOUNTER — Other Ambulatory Visit: Payer: Self-pay | Admitting: *Deleted

## 2024-06-13 VITALS — BP 97/57

## 2024-06-13 DIAGNOSIS — Z3A27 27 weeks gestation of pregnancy: Secondary | ICD-10-CM | POA: Diagnosis not present

## 2024-06-13 DIAGNOSIS — O99012 Anemia complicating pregnancy, second trimester: Secondary | ICD-10-CM | POA: Diagnosis present

## 2024-06-13 DIAGNOSIS — O99332 Smoking (tobacco) complicating pregnancy, second trimester: Secondary | ICD-10-CM | POA: Diagnosis not present

## 2024-06-13 DIAGNOSIS — O35BXX0 Maternal care for other (suspected) fetal abnormality and damage, fetal cardiac anomalies, not applicable or unspecified: Secondary | ICD-10-CM | POA: Diagnosis not present

## 2024-06-13 DIAGNOSIS — Z362 Encounter for other antenatal screening follow-up: Secondary | ICD-10-CM | POA: Insufficient documentation

## 2024-06-13 DIAGNOSIS — F17200 Nicotine dependence, unspecified, uncomplicated: Secondary | ICD-10-CM | POA: Diagnosis not present

## 2024-06-13 DIAGNOSIS — D649 Anemia, unspecified: Secondary | ICD-10-CM

## 2024-06-13 DIAGNOSIS — Z3480 Encounter for supervision of other normal pregnancy, unspecified trimester: Secondary | ICD-10-CM

## 2024-06-13 DIAGNOSIS — O359XX0 Maternal care for (suspected) fetal abnormality and damage, unspecified, not applicable or unspecified: Secondary | ICD-10-CM

## 2024-06-13 DIAGNOSIS — O358XX0 Maternal care for other (suspected) fetal abnormality and damage, not applicable or unspecified: Secondary | ICD-10-CM

## 2024-06-13 DIAGNOSIS — O35FXX0 Maternal care for other (suspected) fetal abnormality and damage, fetal musculoskeletal anomalies of trunk, not applicable or unspecified: Secondary | ICD-10-CM | POA: Diagnosis not present

## 2024-06-13 DIAGNOSIS — Z364 Encounter for antenatal screening for fetal growth retardation: Secondary | ICD-10-CM | POA: Diagnosis not present

## 2024-06-13 DIAGNOSIS — O09892 Supervision of other high risk pregnancies, second trimester: Secondary | ICD-10-CM | POA: Diagnosis present

## 2024-06-13 DIAGNOSIS — F17220 Nicotine dependence, chewing tobacco, uncomplicated: Secondary | ICD-10-CM | POA: Insufficient documentation

## 2024-06-13 NOTE — Progress Notes (Signed)
 After review, MFM consult with provider is not indicated for today  Arna Ranks, MD 06/13/2024 4:57 PM  [facility]

## 2024-06-18 ENCOUNTER — Other Ambulatory Visit

## 2024-06-18 ENCOUNTER — Ambulatory Visit: Payer: Self-pay | Admitting: Obstetrics and Gynecology

## 2024-06-18 ENCOUNTER — Encounter: Payer: Self-pay | Admitting: Obstetrics and Gynecology

## 2024-06-18 VITALS — BP 92/56 | HR 80 | Wt 132.0 lb

## 2024-06-18 DIAGNOSIS — Z3482 Encounter for supervision of other normal pregnancy, second trimester: Secondary | ICD-10-CM | POA: Diagnosis not present

## 2024-06-18 DIAGNOSIS — Z3A27 27 weeks gestation of pregnancy: Secondary | ICD-10-CM

## 2024-06-18 DIAGNOSIS — Z23 Encounter for immunization: Secondary | ICD-10-CM

## 2024-06-18 DIAGNOSIS — Z3480 Encounter for supervision of other normal pregnancy, unspecified trimester: Secondary | ICD-10-CM

## 2024-06-18 NOTE — Progress Notes (Signed)
   PRENATAL VISIT NOTE  Subjective:  Jacqueline Mitchell is a 28 y.o. [name] at [redacted]w[redacted]d being seen today for ongoing prenatal care.  She is currently monitored for the following issues for this low-risk pregnancy and has Supervision of other normal pregnancy, antepartum; Chewing tobacco dependence; and Fetal cardiac anomaly affecting pregnancy, antepartum on their problem list.  Discussed chewing tobacco use. Patient verbalized that she is not ready to quit at this time. Also reports that she occasionally forgets to take PNV.   Patient reports no complaints.  Contractions: Not present. Vag. Bleeding: None.  Movement: Present. Denies leaking of fluid.   The following portions of the patient's history were reviewed and updated as appropriate: allergies, current medications, past family history, past medical history, past social history, past surgical history and problem list.   Objective:    Vitals:   06/18/24 0841  BP: (!) 92/56  Pulse: 80  Weight: 59.9 kg    Fetal Status:  Fetal Heart Rate (bpm): 135   Movement: Present    General: Alert, oriented and cooperative. Patient is in no acute distress.  Skin: Skin is warm and dry. No rash noted.   Cardiovascular: Normal heart rate noted  Respiratory: Normal respiratory effort, no problems with respiration noted  Abdomen: Soft, gravid, appropriate for gestational age.  Pain/Pressure: Absent     Pelvic: Cervical exam deferred   Extremities: Normal range of motion.  Edema: None  Mental Status: Normal mood and affect. Normal behavior. Normal judgment and thought content.   Assessment and Plan:  Pregnancy: [name] at [redacted]w[redacted]d 1. Supervision of other normal pregnancy, antepartum (Primary) - BP WNL; Fetal heart rate normal; encouraged tobacco cessation; encouraged patient to take PNV daily; OB US  scheduled 07/17/2024; return for OV in 2 weeks. - Glucose Tolerance, 2 Hours w/1 Hour - HIV antibody (with reflex) - RPR - CBC  2. [redacted] weeks gestation of  pregnancy - Glucose Tolerance, 2 Hours w/1 Hour - HIV antibody (with reflex) - RPR - CBC  Preterm labor symptoms and general obstetric precautions including but not limited to vaginal bleeding, contractions, leaking of fluid and fetal movement were reviewed in detail with the patient. Please refer to After Visit Summary for other counseling recommendations.    Future Appointments  Date Time Provider Department Center  06/18/2024 10:15 AM Delores Nidia CROME, FNP [facility] None  07/17/2024  2:45 PM [facility] PROVIDER 1 [facility]  07/17/2024  3:00 PM [facility] US4 [facility]    Debby CHRISTELLA Borer, RN

## 2024-06-18 NOTE — Progress Notes (Signed)
 Pt presents for [name] visit. No concerns

## 2024-06-19 ENCOUNTER — Ambulatory Visit: Payer: Self-pay | Admitting: Obstetrics and Gynecology

## 2024-06-19 LAB — CBC
Hematocrit: 29.7 % — ABNORMAL LOW (ref 34.0–46.6)
Hemoglobin: 9.2 g/dL — ABNORMAL LOW (ref 11.1–15.9)
MCH: 27.5 pg (ref 26.6–33.0)
MCHC: 31 g/dL — ABNORMAL LOW (ref 31.5–35.7)
MCV: 89 fL (ref 79–97)
Platelets: 282 x10E3/uL (ref 150–450)
RBC: 3.35 x10E6/uL — ABNORMAL LOW (ref 3.77–5.28)
RDW: 12.5 % (ref 11.7–15.4)
WBC: 10.4 x10E3/uL (ref 3.4–10.8)

## 2024-06-19 LAB — SYPHILIS: RPR W/REFLEX TO RPR TITER AND TREPONEMAL ANTIBODIES, TRADITIONAL SCREENING AND DIAGNOSIS ALGORITHM: RPR Ser Ql: NONREACTIVE

## 2024-06-19 LAB — GLUCOSE TOLERANCE, 2 HOURS W/ 1HR
Glucose, 1 hour: 95 mg/dL (ref 70–179)
Glucose, 2 hour: 76 mg/dL (ref 70–152)
Glucose, Fasting: 62 mg/dL — ABNORMAL LOW (ref 70–91)

## 2024-06-19 LAB — HIV ANTIBODY (ROUTINE TESTING W REFLEX): HIV Screen 4th Generation wRfx: NONREACTIVE

## 2024-06-19 MED ORDER — ACCRUFER 30 MG PO CAPS: ORAL_CAPSULE | ORAL | 4 refills | Status: AC

## 2024-07-02 ENCOUNTER — Ambulatory Visit (INDEPENDENT_AMBULATORY_CARE_PROVIDER_SITE_OTHER): Admitting: Obstetrics and Gynecology

## 2024-07-02 VITALS — BP 87/56 | HR 87 | Wt 130.0 lb

## 2024-07-02 DIAGNOSIS — F17220 Nicotine dependence, chewing tobacco, uncomplicated: Secondary | ICD-10-CM | POA: Diagnosis not present

## 2024-07-02 DIAGNOSIS — O99613 Diseases of the digestive system complicating pregnancy, third trimester: Secondary | ICD-10-CM

## 2024-07-02 DIAGNOSIS — Z3A29 29 weeks gestation of pregnancy: Secondary | ICD-10-CM | POA: Diagnosis not present

## 2024-07-02 DIAGNOSIS — K5900 Constipation, unspecified: Secondary | ICD-10-CM

## 2024-07-02 DIAGNOSIS — O35BXX0 Maternal care for other (suspected) fetal abnormality and damage, fetal cardiac anomalies, not applicable or unspecified: Secondary | ICD-10-CM | POA: Diagnosis not present

## 2024-07-02 DIAGNOSIS — Z3480 Encounter for supervision of other normal pregnancy, unspecified trimester: Secondary | ICD-10-CM

## 2024-07-02 MED ORDER — SENNOSIDES-DOCUSATE SODIUM 8.6-50 MG PO TABS: 1.0000 | ORAL_TABLET | Freq: Every day | ORAL | 2 refills | Status: AC

## 2024-07-02 NOTE — Progress Notes (Signed)
   PRENATAL VISIT NOTE  Subjective:  Jacqueline Mitchell is a 28 y.o. [name] at [redacted]w[redacted]d being seen today for ongoing prenatal care.  She is currently monitored for the following issues for this low-risk pregnancy and has Supervision of other normal pregnancy, antepartum; Chewing tobacco dependence; and Fetal cardiac anomaly affecting pregnancy, antepartum on their problem list.  Patient doing well with no acute concerns today. She reports increased constipation.  Contractions: Not present. Vag. Bleeding: None.  Movement: Present. Denies leaking of fluid.   The following portions of the patient's history were reviewed and updated as appropriate: allergies, current medications, past family history, past medical history, past social history, past surgical history and problem list. Problem list updated.  Objective:   Vitals:   07/02/24 1540  BP: (!) 87/56  Pulse: 87  Weight: 130 lb (59 kg)    Fetal Status: Fetal Heart Rate (bpm): 130 (Simultaneous filing. User may not have seen previous data.) Fundal Height: 29 cm Movement: Present     General:  Alert, oriented and cooperative. Patient is in no acute distress.  Skin: Skin is warm and dry. No rash noted.   Cardiovascular: Normal heart rate noted  Respiratory: Normal respiratory effort, no problems with respiration noted  Abdomen: Soft, gravid, appropriate for gestational age.  Pain/Pressure: Absent     Pelvic: Cervical exam deferred        Extremities: Normal range of motion.     Mental Status:  Normal mood and affect. Normal behavior. Normal judgment and thought content.   Assessment and Plan:  Pregnancy: [name] at [redacted]w[redacted]d  1. [redacted] weeks gestation of pregnancy (Primary)   2. Anomaly of heart of fetus affecting pregnancy, antepartum, single or unspecified fetus Fetus is being followed by MFM. Next scan 07/17/24  3. Supervision of other normal pregnancy, antepartum Continue routine prenatal care  4. Constipation during pregnancy in third  trimester Pt advised to increase hydration,gentle laxative prescribed for assistance  - senna-docusate (SENOKOT-S) 8.6-50 MG tablet; Take 1 tablet by mouth daily.  Dispense: 30 tablet; Refill: 2  5. Chewing tobacco dependence Pt notes she has decreased usage but still uses it 1-2/day  Preterm labor symptoms and general obstetric precautions including but not limited to vaginal bleeding, contractions, leaking of fluid and fetal movement were reviewed in detail with the patient.  Please refer to After Visit Summary for other counseling recommendations.   Return in about 2 weeks (around 07/16/2024) for [name], in person.   Jerilynn Buddle, MD Faculty Attending Center for [facility]

## 2024-07-03 ENCOUNTER — Other Ambulatory Visit: Payer: Self-pay | Admitting: Obstetrics and Gynecology

## 2024-07-03 DIAGNOSIS — Z3480 Encounter for supervision of other normal pregnancy, unspecified trimester: Secondary | ICD-10-CM

## 2024-07-15 ENCOUNTER — Ambulatory Visit: Admitting: Obstetrics and Gynecology

## 2024-07-15 VITALS — BP 97/64 | HR 102 | Wt 134.0 lb

## 2024-07-15 DIAGNOSIS — Z3480 Encounter for supervision of other normal pregnancy, unspecified trimester: Secondary | ICD-10-CM | POA: Diagnosis not present

## 2024-07-15 DIAGNOSIS — Z3A31 31 weeks gestation of pregnancy: Secondary | ICD-10-CM

## 2024-07-15 NOTE — Progress Notes (Signed)
   PRENATAL VISIT NOTE  Subjective:  Jacqueline Mitchell is a 28 y.o. [name] at [redacted]w[redacted]d being seen today for ongoing prenatal care.  She is currently monitored for the following issues for this low-risk pregnancy and has Supervision of other normal pregnancy, antepartum; Chewing tobacco dependence; and Fetal cardiac anomaly affecting pregnancy, antepartum on their problem list.  Patient doing well with no acute concerns today. She reports no complaints.  Contractions: Not present. Vag. Bleeding: None.  Movement: Present. Denies leaking of fluid.   The following portions of the patient's history were reviewed and updated as appropriate: allergies, current medications, past family history, past medical history, past social history, past surgical history and problem list. Problem list updated.  Objective:   Vitals:   07/15/24 1440  BP: 97/64  Pulse: (!) 102  Weight: 134 lb (60.8 kg)    Fetal Status: Fetal Heart Rate (bpm): 132 Fundal Height: 31 cm Movement: Present     General:  Alert, oriented and cooperative. Patient is in no acute distress.  Skin: Skin is warm and dry. No rash noted.   Cardiovascular: Normal heart rate noted  Respiratory: Normal respiratory effort, no problems with respiration noted  Abdomen: Soft, gravid, appropriate for gestational age.  Pain/Pressure: Present     Pelvic: Cervical exam deferred        Extremities: Normal range of motion.  Edema: None  Mental Status:  Normal mood and affect. Normal behavior. Normal judgment and thought content.   Assessment and Plan:  Pregnancy: [name] at [redacted]w[redacted]d  1. [redacted] weeks gestation of pregnancy (Primary)   2. Supervision of other normal pregnancy, antepartum Continue routine prenatal care Fetal scoliosis noted on previous scan, follow up scan on 07/17/24  Preterm labor symptoms and general obstetric precautions including but not limited to vaginal bleeding, contractions, leaking of fluid and fetal movement were reviewed in detail with  the patient.  Please refer to After Visit Summary for other counseling recommendations.   Return in about 2 weeks (around 07/29/2024) for [name], in person.   Jerilynn Buddle, MD Faculty Attending Center for [facility]

## 2024-07-17 ENCOUNTER — Ambulatory Visit: Attending: Obstetrics and Gynecology

## 2024-07-17 ENCOUNTER — Ambulatory Visit (HOSPITAL_BASED_OUTPATIENT_CLINIC_OR_DEPARTMENT_OTHER): Admitting: Obstetrics

## 2024-07-17 VITALS — BP 96/53 | HR 92

## 2024-07-17 DIAGNOSIS — O43193 Other malformation of placenta, third trimester: Secondary | ICD-10-CM

## 2024-07-17 DIAGNOSIS — D649 Anemia, unspecified: Secondary | ICD-10-CM

## 2024-07-17 DIAGNOSIS — Z3480 Encounter for supervision of other normal pregnancy, unspecified trimester: Secondary | ICD-10-CM

## 2024-07-17 DIAGNOSIS — O283 Abnormal ultrasonic finding on antenatal screening of mother: Secondary | ICD-10-CM | POA: Diagnosis not present

## 2024-07-17 DIAGNOSIS — O359XX0 Maternal care for (suspected) fetal abnormality and damage, unspecified, not applicable or unspecified: Secondary | ICD-10-CM | POA: Diagnosis present

## 2024-07-17 DIAGNOSIS — O99013 Anemia complicating pregnancy, third trimester: Secondary | ICD-10-CM | POA: Diagnosis not present

## 2024-07-17 DIAGNOSIS — O358XX0 Maternal care for other (suspected) fetal abnormality and damage, not applicable or unspecified: Secondary | ICD-10-CM | POA: Diagnosis not present

## 2024-07-17 DIAGNOSIS — Z3A31 31 weeks gestation of pregnancy: Secondary | ICD-10-CM

## 2024-07-17 DIAGNOSIS — O99333 Smoking (tobacco) complicating pregnancy, third trimester: Secondary | ICD-10-CM | POA: Insufficient documentation

## 2024-07-18 ENCOUNTER — Other Ambulatory Visit: Payer: Self-pay | Admitting: *Deleted

## 2024-07-18 DIAGNOSIS — O358XX0 Maternal care for other (suspected) fetal abnormality and damage, not applicable or unspecified: Secondary | ICD-10-CM

## 2024-07-18 NOTE — Progress Notes (Signed)
 MFM Consult Note  Jacqueline Mitchell is currently at 31 weeks and 6 days.  She has been followed due to a fetus with a persistent right umbilical vein and possible fetal scoliosis.    She denies any problems since her last exam and reports feeling fetal movements throughout the day.  Sonographic findings Single intrauterine pregnancy at 31w 6d.  Fetal cardiac activity:  Observed and appears normal. Presentation: Cephalic. Fetal biometry shows the estimated fetal weight of 4 pounds 8 ounces which measuresat the 68th percentile. Amniotic fluid volume: Within normal limits. MVP: 6.31 cm. Placenta: Anterior.  The persistent right umbilical vein continues to be noted today.    Right pyelectasis measuring 0.7 cm dilated was noted today.  The patient was advised that this is most likely a normal variant.  We will continue to assess the fetal kidneys during her future exams.    She was advised that her baby will have to be examined after birth to determine if scoliosis is present and if any treatment is necessary.    A follow-up exam was scheduled in 5 weeks.    The patient stated that all of her questions were answered today.  A total of 10 minutes was spent counseling and coordinating the care for this patient.  Greater than 50% of the time was spent in direct face-to-face contact.

## 2024-07-28 ENCOUNTER — Ambulatory Visit (INDEPENDENT_AMBULATORY_CARE_PROVIDER_SITE_OTHER): Admitting: Obstetrics and Gynecology

## 2024-07-28 VITALS — BP 99/63 | HR 106 | Wt 135.0 lb

## 2024-07-28 DIAGNOSIS — O35BXX0 Maternal care for other (suspected) fetal abnormality and damage, fetal cardiac anomalies, not applicable or unspecified: Secondary | ICD-10-CM | POA: Diagnosis not present

## 2024-07-28 DIAGNOSIS — Z3A33 33 weeks gestation of pregnancy: Secondary | ICD-10-CM | POA: Diagnosis not present

## 2024-07-28 DIAGNOSIS — F17220 Nicotine dependence, chewing tobacco, uncomplicated: Secondary | ICD-10-CM

## 2024-07-28 DIAGNOSIS — Z3480 Encounter for supervision of other normal pregnancy, unspecified trimester: Secondary | ICD-10-CM

## 2024-07-28 NOTE — Progress Notes (Signed)
   PRENATAL VISIT NOTE  Subjective:  Jacqueline Mitchell is a 28 y.o. [name] at [redacted]w[redacted]d being seen today for ongoing prenatal care.  She is currently monitored for the following issues for this high-risk pregnancy and has Supervision of other normal pregnancy, antepartum; Chewing tobacco dependence; and Fetal cardiac anomaly affecting pregnancy, antepartum on their problem list.  Patient doing well with no acute concerns today. She reports fatigue.  Contractions: Not present. Vag. Bleeding: None.  Movement: Present. Denies leaking of fluid.   The following portions of the patient's history were reviewed and updated as appropriate: allergies, current medications, past family history, past medical history, past social history, past surgical history and problem list. Problem list updated.  Objective:   Vitals:   07/28/24 1541  BP: 99/63  Pulse: (!) 106  Weight: 135 lb (61.2 kg)    Fetal Status: Fetal Heart Rate (bpm): 140 Fundal Height: 34 cm Movement: Present     General:  Alert, oriented and cooperative. Patient is in no acute distress.  Skin: Skin is warm and dry. No rash noted.   Cardiovascular: Normal heart rate noted  Respiratory: Normal respiratory effort, no problems with respiration noted  Abdomen: Soft, gravid, appropriate for gestational age.  Pain/Pressure: Absent     Pelvic: Cervical exam deferred        Extremities: Normal range of motion.     Mental Status:  Normal mood and affect. Normal behavior. Normal judgment and thought content.   Assessment and Plan:  Pregnancy: [name] at [redacted]w[redacted]d  1. [redacted] weeks gestation of pregnancy (Primary)   2. 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 Supervision of other normal pregnancy, antepartum Continue routine prenatal care Pt declines flu shot  4. Chewing tobacco dependence Pt still notes intermittent chewing tobacco usage, poor dentition  noted  Preterm labor symptoms and general obstetric precautions including but not limited to vaginal bleeding, contractions, leaking of fluid and fetal movement were reviewed in detail with the patient.  Please refer to After Visit Summary for other counseling recommendations.   Return in about 2 weeks (around 08/11/2024) for [name], in person.   Jerilynn Buddle, MD Faculty Attending Center for [facility]

## 2024-08-11 ENCOUNTER — Encounter: Payer: Self-pay | Admitting: Obstetrics and Gynecology

## 2024-08-11 ENCOUNTER — Ambulatory Visit: Admitting: Obstetrics and Gynecology

## 2024-08-11 VITALS — BP 103/68 | HR 103 | Wt 135.0 lb

## 2024-08-11 DIAGNOSIS — Z3480 Encounter for supervision of other normal pregnancy, unspecified trimester: Secondary | ICD-10-CM

## 2024-08-11 DIAGNOSIS — F17220 Nicotine dependence, chewing tobacco, uncomplicated: Secondary | ICD-10-CM | POA: Diagnosis not present

## 2024-08-11 DIAGNOSIS — O35BXX0 Maternal care for other (suspected) fetal abnormality and damage, fetal cardiac anomalies, not applicable or unspecified: Secondary | ICD-10-CM

## 2024-08-11 DIAGNOSIS — Z3A35 35 weeks gestation of pregnancy: Secondary | ICD-10-CM | POA: Diagnosis not present

## 2024-08-11 NOTE — Progress Notes (Signed)
 PRENATAL VISIT NOTE  Subjective:  Jacqueline Mitchell is a 28 y.o. [name] at [redacted]w[redacted]d being seen today for ongoing prenatal care.  She is currently monitored for the following issues for this low-risk pregnancy and has Supervision of other normal pregnancy, antepartum; Chewing tobacco dependence; and Fetal cardiac anomaly affecting pregnancy, antepartum on their problem list.  Patient reports no complaints.  Contractions: Not present. Vag. Bleeding: None.  Movement: Present. Denies leaking of fluid.   The following portions of the patient's history were reviewed and updated as appropriate: allergies, current medications, past family history, past medical history, past social history, past surgical history and problem list.   Objective:   Vitals:   08/11/24 1450  BP: 103/68  Pulse: (!) 103  Weight: 135 lb (61.2 kg)    Fetal Status:  Fetal Heart Rate (bpm): 159 Fundal Height: 35 cm Movement: Present    General: Alert, oriented and cooperative. Patient is in no acute distress.  Skin: Skin is warm and dry. No rash noted.   Cardiovascular: Normal heart rate noted  Respiratory: Normal respiratory effort, no problems with respiration noted  Abdomen: Soft, gravid, appropriate for gestational age.  Pain/Pressure: Present     Pelvic: Cervical exam deferred        Extremities: Normal range of motion.  Edema: None  Mental Status: Normal mood and affect. Normal behavior. Normal judgment and thought content.      06/18/2024    8:44 AM 01/30/2024    1:35 PM 06/28/2023    2:22 PM  Depression screen PHQ 2/9  Decreased Interest 0 0 0  Down, Depressed, Hopeless 0 0 0  PHQ - 2 Score 0 0 0  Altered sleeping 0 0 0  Tired, decreased energy 0 0 0  Change in appetite 0 0 0  Feeling bad or failure about yourself  0 0 0  Trouble concentrating 0 0 0  Moving slowly or fidgety/restless 0 0 0  Suicidal thoughts 0 0 0  PHQ-9 Score 0 0 0        06/18/2024    8:44 AM 01/30/2024    1:36 PM 12/31/2023    3:08 PM  06/28/2023    2:23 PM  GAD 7 : Generalized Anxiety Score  Nervous, Anxious, on Edge 0 0 0 0  Control/stop worrying 0 0 0 0  Worry too much - different things 0 0 0 0  Trouble relaxing 0 0 0 0  Restless 0 0 0 0  Easily annoyed or irritable 0 0 0 0  Afraid - awful might happen 0 0 0 0  Total GAD 7 Score 0 0 0 0    Assessment and Plan:  Pregnancy: [name] at [redacted]w[redacted]d 1. Supervision of other normal pregnancy, antepartum (Primary) Patient is doing well without complaints Plans depo-provera  for contraception Cultures next visit  2. Anomaly of heart of fetus affecting pregnancy, antepartum, single or unspecified fetus Followed by MFM for persistent right umbilical vein Follow up ultrasound on 11/18  3. Chewing tobacco dependence   Preterm labor symptoms and general obstetric precautions including but not limited to vaginal bleeding, contractions, leaking of fluid and fetal movement were reviewed in detail with the patient. Please refer to After Visit Summary for other counseling recommendations.   Return in about 1 week (around 08/18/2024) for in person, [name], Low risk.  Future Appointments  Date Time Provider Department Center  08/26/2024  2:00 PM [facility] PROVIDER 1 [facility]  08/26/2024  2:15 PM [facility] US4 [facility]  08/27/2024  2:50 PM Delores Nidia CROME, FNP [facility] None    Winton Felt, MD

## 2024-08-26 ENCOUNTER — Ambulatory Visit: Attending: Obstetrics

## 2024-08-26 ENCOUNTER — Ambulatory Visit (HOSPITAL_BASED_OUTPATIENT_CLINIC_OR_DEPARTMENT_OTHER): Admitting: Obstetrics and Gynecology

## 2024-08-26 DIAGNOSIS — O283 Abnormal ultrasonic finding on antenatal screening of mother: Secondary | ICD-10-CM | POA: Diagnosis not present

## 2024-08-26 DIAGNOSIS — Z362 Encounter for other antenatal screening follow-up: Secondary | ICD-10-CM | POA: Diagnosis not present

## 2024-08-26 DIAGNOSIS — Z3480 Encounter for supervision of other normal pregnancy, unspecified trimester: Secondary | ICD-10-CM | POA: Diagnosis present

## 2024-08-26 DIAGNOSIS — Z3A37 37 weeks gestation of pregnancy: Secondary | ICD-10-CM | POA: Insufficient documentation

## 2024-08-26 DIAGNOSIS — D649 Anemia, unspecified: Secondary | ICD-10-CM | POA: Diagnosis not present

## 2024-08-26 DIAGNOSIS — O99013 Anemia complicating pregnancy, third trimester: Secondary | ICD-10-CM | POA: Diagnosis not present

## 2024-08-26 DIAGNOSIS — O99333 Smoking (tobacco) complicating pregnancy, third trimester: Secondary | ICD-10-CM | POA: Diagnosis not present

## 2024-08-26 DIAGNOSIS — O358XX0 Maternal care for other (suspected) fetal abnormality and damage, not applicable or unspecified: Secondary | ICD-10-CM | POA: Diagnosis present

## 2024-08-26 NOTE — Progress Notes (Signed)
 Maternal-Fetal Medicine Consultation  Name: Jacqueline Mitchell  MRN: [id number]  GA: [name] [redacted]w[redacted]d   -Persistent right umbilical vein. - Fetal scoliosis (T10-T11). Fetal growth is appropriate for gestational age.  Amniotic fluid normal good fetal activity seen.  Persistent right umbilical vein is seen.  Fetal spine could not be assessed clearly on today's ultrasound. I discussed timing of delivery.  Although persistent right umbilical vein is isolated, ultrasound has limitations in detecting other fetal anomalies.  I recommended delivery at [redacted] weeks gestation.  Recommendations -Recommend delivery at 39-weeks' gestation.     Consultation including face-to-face (more than 50%) counseling 10 minutes.

## 2024-08-27 ENCOUNTER — Other Ambulatory Visit (HOSPITAL_COMMUNITY)
Admission: RE | Admit: 2024-08-27 | Discharge: 2024-08-27 | Disposition: A | Source: Ambulatory Visit | Attending: Obstetrics and Gynecology | Admitting: Obstetrics and Gynecology

## 2024-08-27 ENCOUNTER — Ambulatory Visit: Admitting: Obstetrics and Gynecology

## 2024-08-27 ENCOUNTER — Encounter: Payer: Self-pay | Admitting: Obstetrics and Gynecology

## 2024-08-27 VITALS — BP 101/69 | HR 109 | Wt 133.2 lb

## 2024-08-27 DIAGNOSIS — F17220 Nicotine dependence, chewing tobacco, uncomplicated: Secondary | ICD-10-CM | POA: Diagnosis not present

## 2024-08-27 DIAGNOSIS — O35BXX1 Maternal care for other (suspected) fetal abnormality and damage, fetal cardiac anomalies, fetus 1: Secondary | ICD-10-CM | POA: Diagnosis not present

## 2024-08-27 DIAGNOSIS — Z3A37 37 weeks gestation of pregnancy: Secondary | ICD-10-CM | POA: Diagnosis not present

## 2024-08-27 DIAGNOSIS — O35BXX0 Maternal care for other (suspected) fetal abnormality and damage, fetal cardiac anomalies, not applicable or unspecified: Secondary | ICD-10-CM

## 2024-08-27 DIAGNOSIS — Z3483 Encounter for supervision of other normal pregnancy, third trimester: Secondary | ICD-10-CM | POA: Diagnosis present

## 2024-08-27 DIAGNOSIS — Z3480 Encounter for supervision of other normal pregnancy, unspecified trimester: Secondary | ICD-10-CM

## 2024-08-27 NOTE — Progress Notes (Signed)
 Pt presents for [name] visit. C/o pain in the right hip that radiates down. Need refill of PNV. Requesting cervical check

## 2024-08-27 NOTE — Progress Notes (Signed)
   PRENATAL VISIT NOTE  Subjective:  Jacqueline Mitchell is a 28 y.o. [name] at [redacted]w[redacted]d being seen today for ongoing prenatal care.  She is currently monitored for the following issues for this low-risk pregnancy and has Supervision of other normal pregnancy, antepartum; Chewing tobacco dependence; and Fetal cardiac anomaly affecting pregnancy, antepartum on their problem list.  Patient reports hip pain that radiates down leg.  Contractions: Not present. Vag. Bleeding: None.  Movement: Present. Denies leaking of fluid.   The following portions of the patient's history were reviewed and updated as appropriate: allergies, current medications, past family history, past medical history, past social history, past surgical history and problem list.   Objective:   Vitals:   08/27/24 1456  BP: 101/69  Pulse: (!) 109  Weight: 133 lb 3.2 oz (60.4 kg)    Fetal Status:  Fetal Heart Rate (bpm): 147   Movement: Present Presentation: Vertex  General: Alert, oriented and cooperative. Patient is in no acute distress.  Skin: Skin is warm and dry. No rash noted.   Cardiovascular: Normal heart rate noted  Respiratory: Normal respiratory effort, no problems with respiration noted  Abdomen: Soft, gravid, appropriate for gestational age.  Pain/Pressure: Present     Pelvic: Cervical exam performed in the presence of a chaperone Dilation: 2 Effacement (%): 50 Station: -3  Extremities: Normal range of motion.  Edema: None  Mental Status: Normal mood and affect. Normal behavior. Normal judgment and thought content.   Assessment and Plan:  Pregnancy: [name] at [redacted]w[redacted]d 1. Supervision of other normal pregnancy, antepartum (Primary) BP and FHR normal Doing well, feeling regular movement    2. Chewing tobacco dependence   3. Anomaly of heart of fetus affecting pregnancy, antepartum, single or unspecified fetus 11/18 persistent right umbilical vein Fetal scoliosis Normal growth  Per MFM delivery 39 weeks IOL scheduled  today   4. [redacted] weeks gestation of pregnancy Swabs today  Labor precautions and labor readiness discussed   Term labor symptoms and general obstetric precautions including but not limited to vaginal bleeding, contractions, leaking of fluid and fetal movement were reviewed in detail with the patient. Please refer to After Visit Summary for other counseling recommendations.   Return in 2 weeks OB visit      Nidia Daring, FNP

## 2024-08-28 ENCOUNTER — Ambulatory Visit: Payer: Self-pay | Admitting: Obstetrics and Gynecology

## 2024-08-28 ENCOUNTER — Telehealth (HOSPITAL_COMMUNITY): Payer: Self-pay | Admitting: *Deleted

## 2024-08-28 ENCOUNTER — Encounter (HOSPITAL_COMMUNITY): Payer: Self-pay | Admitting: *Deleted

## 2024-08-28 DIAGNOSIS — O99820 Streptococcus B carrier state complicating pregnancy: Secondary | ICD-10-CM

## 2024-08-28 LAB — CERVICOVAGINAL ANCILLARY ONLY
Chlamydia: NEGATIVE
Comment: NEGATIVE
Comment: NORMAL
Neisseria Gonorrhea: NEGATIVE

## 2024-08-28 NOTE — Telephone Encounter (Signed)
 Preadmission screen

## 2024-08-30 DIAGNOSIS — O99820 Streptococcus B carrier state complicating pregnancy: Secondary | ICD-10-CM | POA: Insufficient documentation

## 2024-08-30 LAB — CULTURE, BETA STREP (GROUP B ONLY): Strep Gp B Culture: POSITIVE — AB

## 2024-08-31 ENCOUNTER — Other Ambulatory Visit: Payer: Self-pay

## 2024-08-31 ENCOUNTER — Encounter (HOSPITAL_COMMUNITY): Payer: Self-pay | Admitting: Obstetrics & Gynecology

## 2024-08-31 ENCOUNTER — Inpatient Hospital Stay (HOSPITAL_COMMUNITY)
Admission: AD | Admit: 2024-08-31 | Discharge: 2024-09-01 | DRG: 807 | Disposition: A | Discharge status: Home / Self Care | Attending: Obstetrics & Gynecology | Admitting: Obstetrics & Gynecology

## 2024-08-31 ENCOUNTER — Inpatient Hospital Stay (HOSPITAL_COMMUNITY): Admitting: Anesthesiology

## 2024-08-31 DIAGNOSIS — O99820 Streptococcus B carrier state complicating pregnancy: Secondary | ICD-10-CM | POA: Diagnosis not present

## 2024-08-31 DIAGNOSIS — Z3480 Encounter for supervision of other normal pregnancy, unspecified trimester: Secondary | ICD-10-CM

## 2024-08-31 DIAGNOSIS — O99334 Smoking (tobacco) complicating childbirth: Secondary | ICD-10-CM | POA: Diagnosis not present

## 2024-08-31 DIAGNOSIS — F17220 Nicotine dependence, chewing tobacco, uncomplicated: Secondary | ICD-10-CM | POA: Diagnosis not present

## 2024-08-31 DIAGNOSIS — Z3A38 38 weeks gestation of pregnancy: Secondary | ICD-10-CM

## 2024-08-31 DIAGNOSIS — O35BXX0 Maternal care for other (suspected) fetal abnormality and damage, fetal cardiac anomalies, not applicable or unspecified: Secondary | ICD-10-CM

## 2024-08-31 LAB — CBC
HCT: 28.7 % — ABNORMAL LOW (ref 36.0–46.0)
Hemoglobin: 8.7 g/dL — ABNORMAL LOW (ref 12.0–15.0)
MCH: 23.7 pg — ABNORMAL LOW (ref 26.0–34.0)
MCHC: 30.3 g/dL (ref 30.0–36.0)
MCV: 78.2 fL — ABNORMAL LOW (ref 80.0–100.0)
Platelets: 298 K/uL (ref 150–400)
RBC: 3.67 MIL/uL — ABNORMAL LOW (ref 3.87–5.11)
RDW: 14.9 % (ref 11.5–15.5)
WBC: 16.2 K/uL — ABNORMAL HIGH (ref 4.0–10.5)
nRBC: 0.2 % (ref 0.0–0.2)

## 2024-08-31 LAB — SYPHILIS: RPR W/REFLEX TO RPR TITER AND TREPONEMAL ANTIBODIES, TRADITIONAL SCREENING AND DIAGNOSIS ALGORITHM: RPR Ser Ql: NONREACTIVE

## 2024-08-31 LAB — TYPE AND SCREEN
ABO/RH(D): B POS
Antibody Screen: NEGATIVE

## 2024-08-31 MED ORDER — LIDOCAINE HCL (PF) 1 % IJ SOLN: 30.0000 mL | INTRAMUSCULAR | Status: DC | PRN

## 2024-08-31 MED ORDER — DIPHENHYDRAMINE HCL 50 MG/ML IJ SOLN: 12.5000 mg | INTRAMUSCULAR | Status: DC | PRN

## 2024-08-31 MED ORDER — TETANUS-DIPHTH-ACELL PERTUSSIS 5-2-15.5 LF-MCG/0.5 IM SUSP: 0.5000 mL | Freq: Once | INTRAMUSCULAR | Status: DC

## 2024-08-31 MED ORDER — ZOLPIDEM TARTRATE 5 MG PO TABS
5.0000 mg | ORAL_TABLET | Freq: Every evening | ORAL | Status: DC | PRN
Start: 2024-08-31 — End: 2024-09-01

## 2024-08-31 MED ORDER — ONDANSETRON HCL 4 MG PO TABS: 4.0000 mg | ORAL_TABLET | ORAL | Status: DC | PRN

## 2024-08-31 MED ORDER — ONDANSETRON HCL 4 MG/2ML IJ SOLN: 4.0000 mg | Freq: Four times a day (QID) | INTRAMUSCULAR | Status: DC | PRN

## 2024-08-31 MED ORDER — BENZOCAINE-MENTHOL 20-0.5 % EX AERO: 1.0000 | INHALATION_SPRAY | CUTANEOUS | Status: DC | PRN

## 2024-08-31 MED ORDER — ACETAMINOPHEN 325 MG PO TABS: 650.0000 mg | ORAL_TABLET | ORAL | Status: DC | PRN

## 2024-08-31 MED ORDER — COCONUT OIL OIL: 1.0000 | TOPICAL_OIL | Status: DC | PRN

## 2024-08-31 MED ORDER — TERBUTALINE SULFATE 1 MG/ML IJ SOLN
INTRAMUSCULAR | Status: AC
  Administered 2024-08-31: 1 mg
  Filled 2024-08-31: qty 1

## 2024-08-31 MED ORDER — LACTATED RINGERS IV SOLN
500.0000 mL | INTRAVENOUS | Status: DC | PRN
  Administered 2024-08-31: 1000 mL via INTRAVENOUS

## 2024-08-31 MED ORDER — DIPHENHYDRAMINE HCL 25 MG PO CAPS: 25.0000 mg | ORAL_CAPSULE | Freq: Four times a day (QID) | ORAL | Status: DC | PRN

## 2024-08-31 MED ORDER — FENTANYL CITRATE (PF) 100 MCG/2ML IJ SOLN: 50.0000 ug | INTRAMUSCULAR | Status: DC | PRN

## 2024-08-31 MED ORDER — LIDOCAINE HCL (PF) 1 % IJ SOLN
INTRAMUSCULAR | Status: DC | PRN
  Administered 2024-08-31 (×2): 4 mL via EPIDURAL

## 2024-08-31 MED ORDER — DIBUCAINE (PERIANAL) 1 % EX OINT
1.0000 | TOPICAL_OINTMENT | CUTANEOUS | Status: DC | PRN
Start: 2024-08-31 — End: 2024-09-01

## 2024-08-31 MED ORDER — PENICILLIN G POT IN DEXTROSE 60000 UNIT/ML IV SOLN: 3.0000 10*6.[IU] | INTRAVENOUS | Status: DC

## 2024-08-31 MED ORDER — TERBUTALINE SULFATE 1 MG/ML IJ SOLN
INTRAMUSCULAR | Status: AC
  Filled 2024-08-31: qty 1

## 2024-08-31 MED ORDER — OXYTOCIN-SODIUM CHLORIDE 30-0.9 UT/500ML-% IV SOLN
2.5000 [IU]/h | INTRAVENOUS | Status: DC
  Administered 2024-08-31: 2.5 [IU]/h via INTRAVENOUS
  Filled 2024-08-31: qty 500

## 2024-08-31 MED ORDER — LACTATED RINGERS IV SOLN: 500.0000 mL | Freq: Once | INTRAVENOUS | Status: DC

## 2024-08-31 MED ORDER — SODIUM CHLORIDE 0.9 % IV SOLN
5.0000 10*6.[IU] | Freq: Once | INTRAVENOUS | Status: AC
  Administered 2024-08-31: 5 10*6.[IU] via INTRAVENOUS
  Filled 2024-08-31: qty 5

## 2024-08-31 MED ORDER — EPHEDRINE 5 MG/ML INJ
10.0000 mg | INTRAVENOUS | Status: DC | PRN
Start: 2024-08-31 — End: 2024-08-31
  Filled 2024-08-31: qty 5

## 2024-08-31 MED ORDER — WITCH HAZEL-GLYCERIN EX PADS: 1.0000 | MEDICATED_PAD | CUTANEOUS | Status: DC | PRN

## 2024-08-31 MED ORDER — PRENATAL MULTIVITAMIN CH
1.0000 | ORAL_TABLET | Freq: Every day | ORAL | Status: DC
  Administered 2024-09-01: 1 via ORAL
  Filled 2024-08-31: qty 1

## 2024-08-31 MED ORDER — FENTANYL-BUPIVACAINE-NACL 0.5-0.125-0.9 MG/250ML-% EP SOLN
12.0000 mL/h | EPIDURAL | Status: DC | PRN
  Administered 2024-08-31: 10 mL/h via EPIDURAL
  Filled 2024-08-31: qty 250

## 2024-08-31 MED ORDER — SOD CITRATE-CITRIC ACID 500-334 MG/5ML PO SOLN
30.0000 mL | ORAL | Status: DC | PRN
  Filled 2024-08-31: qty 30

## 2024-08-31 MED ORDER — OXYTOCIN BOLUS FROM INFUSION
333.0000 mL | Freq: Once | INTRAVENOUS | Status: AC
  Administered 2024-08-31: 333 mL via INTRAVENOUS

## 2024-08-31 MED ORDER — SIMETHICONE 80 MG PO CHEW: 80.0000 mg | CHEWABLE_TABLET | ORAL | Status: DC | PRN

## 2024-08-31 MED ORDER — PHENYLEPHRINE 80 MCG/ML (10ML) SYRINGE FOR IV PUSH (FOR BLOOD PRESSURE SUPPORT)
80.0000 ug | PREFILLED_SYRINGE | INTRAVENOUS | Status: DC | PRN
  Filled 2024-08-31: qty 10

## 2024-08-31 MED ORDER — SENNOSIDES-DOCUSATE SODIUM 8.6-50 MG PO TABS
2.0000 | ORAL_TABLET | Freq: Every day | ORAL | Status: DC
  Administered 2024-09-01: 2 via ORAL
  Filled 2024-08-31: qty 2

## 2024-08-31 MED ORDER — TERBUTALINE SULFATE 1 MG/ML IJ SOLN
0.2500 mg | Freq: Once | INTRAMUSCULAR | Status: AC
  Administered 2024-08-31: 0.25 mg via SUBCUTANEOUS

## 2024-08-31 MED ORDER — IBUPROFEN 600 MG PO TABS
600.0000 mg | ORAL_TABLET | Freq: Four times a day (QID) | ORAL | Status: DC
  Administered 2024-08-31 – 2024-09-01 (×3): 600 mg via ORAL
  Filled 2024-08-31 (×3): qty 1

## 2024-08-31 MED ORDER — LACTATED RINGERS IV SOLN: INTRAVENOUS | Status: DC

## 2024-08-31 MED ORDER — EPHEDRINE 5 MG/ML INJ
10.0000 mg | INTRAVENOUS | Status: DC | PRN
Start: 2024-08-31 — End: 2024-08-31
  Administered 2024-08-31: 10 mg via INTRAVENOUS

## 2024-08-31 MED ORDER — ACETAMINOPHEN 325 MG PO TABS
650.0000 mg | ORAL_TABLET | ORAL | Status: DC | PRN
  Administered 2024-09-01: 650 mg via ORAL
  Filled 2024-08-31: qty 2

## 2024-08-31 MED ORDER — PHENYLEPHRINE 80 MCG/ML (10ML) SYRINGE FOR IV PUSH (FOR BLOOD PRESSURE SUPPORT)
80.0000 ug | PREFILLED_SYRINGE | INTRAVENOUS | Status: DC | PRN
Start: 2024-08-31 — End: 2024-08-31
  Administered 2024-08-31: 80 ug via INTRAVENOUS

## 2024-08-31 MED ORDER — ONDANSETRON HCL 4 MG/2ML IJ SOLN: 4.0000 mg | INTRAMUSCULAR | Status: DC | PRN

## 2024-08-31 NOTE — Anesthesia Preprocedure Evaluation (Signed)
 Anesthesia Evaluation  Patient identified by MRN, date of birth, ID band Patient awake    Reviewed: Allergy & Precautions, NPO status , Patient's Chart, lab work & pertinent test results  History of Anesthesia Complications Negative for: history of anesthetic complications  Airway Mallampati: III  TM Distance: >3 FB Neck ROM: Full    Dental   Pulmonary  Smokeless tobacco use   Pulmonary exam normal breath sounds clear to auscultation       Cardiovascular negative cardio ROS  Rhythm:Regular Rate:Normal     Neuro/Psych negative neurological ROS     GI/Hepatic negative GI ROS, Neg liver ROS,,,  Endo/Other  negative endocrine ROS    Renal/GU negative Renal ROS     Musculoskeletal   Abdominal   Peds  Hematology  (+) Blood dyscrasia, anemia Lab Results      Component                Value               Date                      WBC                      16.2 (H)            08/31/2024                HGB                      8.7 (L)             08/31/2024                HCT                      28.7 (L)            08/31/2024                MCV                      78.2 (L)            08/31/2024                PLT                      298                 08/31/2024              Anesthesia Other Findings   Reproductive/Obstetrics (+) Pregnancy                              Anesthesia Physical Anesthesia Plan  ASA: 2  Anesthesia Plan: Epidural   Post-op Pain Management:    Induction:   PONV Risk Score and Plan:   Airway Management Planned: Natural Airway  Additional Equipment:   Intra-op Plan:   Post-operative Plan:   Informed Consent: I have reviewed the patients History and Physical, chart, labs and discussed the procedure including the risks, benefits and alternatives for the proposed anesthesia with the patient or authorized representative who has indicated his/her understanding  and acceptance.       Plan Discussed with: Anesthesiologist  Anesthesia Plan Comments: (I have discussed risks of  neuraxial anesthesia including but not limited to infection, bleeding, nerve injury, back pain, headache, seizures, and failure of block. Patient denies bleeding disorders and is not currently anticoagulated. Labs have been reviewed. Risks and benefits discussed. All patient's questions answered.  )        Anesthesia Quick Evaluation

## 2024-08-31 NOTE — Progress Notes (Signed)
 Called to the room for fetal bradycardia Patient is s/p terbutaline , pseudoephedrine and is receiving ephedrine  Currently in hands and knees Epidural in place  FHR was initially in the 80s and rebounded to 130s while I was in the room. She was transitioned after about 2-3 minutes to right lateral/sidelying with a stable FHR.   Discussed FHR tracing and concerns about ability to tolerate labor. Likely FHR decelerations were due to tachystyole but also at risk for not tolerating labor. Reviewed that our goals are to have a vaginal delivery and if another deceleration episode that required terbutaline  were to arise we would like recommend an urgent/emergent CS.   Consented for C-section  Indication for C-section: non-reassuring fetal status   The risks of cesarean section were discussed with the patient including but were not limited to: bleeding which may require transfusion or reoperation; infection which may require antibiotics; injury to bowel, bladder, ureters or other surrounding organs; injury to the fetus; need for additional procedures including hysterectomy in the event of a life-threatening hemorrhage; placental abnormalities wth subsequent pregnancies, incisional problems, thromboembolic phenomenon and other postoperative/anesthesia complications.    No need for emergent CS currently.  Patient signed consent in case need arises Consent witnessed by Dr. Lynwood Solomons  Suzen Maryan Masters, MD, MPH, ABFM, [facility] Attending Physician Center for [facility]

## 2024-08-31 NOTE — MAU Provider Note (Signed)
 S: [name] is a 28 y.o. [phone] at [redacted]w[redacted]d  who presents to MAU today complaining contractions q 2-3 minutes since this morning. She denies vaginal bleeding. She denies LOF. She reports normal fetal movement.    O: BP 112/70   Pulse 91   Temp 98.4 F (36.9 C) (Oral)   Resp 18   Ht 5' 1 (1.549 m)   Wt 59.9 kg   LMP  (LMP Unknown)   SpO2 99%   BMI 24.94 kg/m  GENERAL: Well-developed, well-nourished female in no acute distress.  HEAD: Normocephalic, atraumatic.  CHEST: Normal effort of breathing, regular heart rate ABDOMEN: Soft, nontender, gravid  Cervical exam:   Dilation: 3.5 Effacement (%): 70 Station: -2 Presentation: Vertex Exam by:: Nat Mule, RN   Fetal Monitoring: category 2 fetal tracing. Repetitive variables. Discussed tracing with Dr. Magali. Patient is on her right side, IV being established.    A: SIUP at [redacted]w[redacted]d  Active labor  P:  Admit to labor Care turned over to MD Patient is stable for transfer.   Dorita Delon FERNS, NP 08/31/2024 11:24 AM

## 2024-08-31 NOTE — H&P (Addendum)
 OBSTETRIC ADMISSION HISTORY AND PHYSICAL  Jacqueline Mitchell is a 28 y.o. female [phone] with IUP at [redacted]w[redacted]d by 1st tri US  presenting for SOL. She reports +FMs, No LOF, no VB, no blurry vision, headaches or peripheral edema, and RUQ pain. She plans on formula feeding. She request depo for birth control. She received her prenatal care at [facility]   Dating: By US  @ [redacted]w[redacted]d --->  Estimated Date of Delivery: 09/12/24  Sono:    @[redacted]w[redacted]d , CWD, normal anatomy, cephalic presentation, 3138g, 50% EFW, persistent right umbilical vein and fetal scoliosis (T10-T11)   Prenatal History/Complications: Anemia during pregnancy, chewing tobacco use during pregnancy  Past Medical History: Past Medical History:  Diagnosis Date   Anemia     Past Surgical History: Past Surgical History:  Procedure Laterality Date   NO PAST SURGERIES      Obstetrical History: OB History     Gravida  5   Para  2   Term  2   Preterm  0   AB  2   Living  2      SAB  2   IAB  0   Ectopic  0   Multiple  0   Live Births  2           Social History Social History   Socioeconomic History   Marital status: Single    Spouse name: Not on file   Number of children: Not on file   Years of education: Not on file   Highest education level: Not on file  Occupational History   Not on file  Tobacco Use   Smoking status: Never   Smokeless tobacco: Current    Types: Chew  Vaping Use   Vaping status: Never Used  Substance and Sexual Activity   Alcohol use: Not Currently    Comment: Occasional, hx alcoholism/ drinking with pregnancy/ denies currently   Drug use: Never   Sexual activity: Yes    Partners: Male    Birth control/protection: None  Other Topics Concern   Not on file  Social History Narrative   Not on file   Social Drivers of Health   Financial Resource Strain: Not on file  Food Insecurity: No Food Insecurity (08/31/2024)   Hunger Vital Sign    Worried About Running Out of Food in the Last Year:  Never true    Ran Out of Food in the Last Year: Never true  Transportation Needs: No Transportation Needs (08/31/2024)   PRAPARE - Administrator, Civil Service (Medical): No    Lack of Transportation (Non-Medical): No  Physical Activity: Not on file  Stress: Not on file  Social Connections: Not on file    Family History: Family History  Problem Relation Age of Onset   Diabetes Mother    Miscarriages / Stillbirths Mother    Healthy Father     Allergies: No Known Allergies  Medications Prior to Admission  Medication Sig Dispense Refill Last Dose/Taking   Blood Pressure Monitoring (BLOOD PRESSURE KIT) DEVI 1 Device by Does not apply route once a week. (Patient not taking: Reported on 06/18/2024) 1 each 0    Ferric Maltol  (ACCRUFER ) 30 MG CAPS Take one tablet by mouth twice a day before meals 60 capsule 4    metroNIDAZOLE  (FLAGYL ) 500 MG tablet Take 1 tablet (500 mg total) by mouth 2 (two) times daily. 14 tablet 0    Prenatal Vit-Fe Fumarate-FA (PRENATAL VITAMIN PO) Take 1 tablet by mouth daily. (Patient  not taking: Reported on 08/27/2024)      senna-docusate (SENOKOT-S) 8.6-50 MG tablet Take 1 tablet by mouth daily. (Patient not taking: Reported on 08/27/2024) 30 tablet 2    terconazole  (TERAZOL 3 ) 0.8 % vaginal cream Place 1 applicator vaginally at bedtime. Apply nightly for three nights. (Patient not taking: Reported on 06/18/2024) 20 g 0      Review of Systems   All systems reviewed and negative except as stated in HPI  Blood pressure 105/66, pulse 80, temperature 98.4 F (36.9 C), temperature source Oral, resp. rate 18, height 5' 1 (1.549 m), weight 59.9 kg, SpO2 98%, unknown if currently breastfeeding. General appearance: alert, cooperative, and no distress Lungs: clear to auscultation bilaterally Heart: regular rate and rhythm Abdomen: soft, non-tender; bowel sounds normal Extremities: Homans sign is negative, no sign of DVT Presentation: cephalic Fetal  monitoringBaseline: 140 bpm, Variability: Good {> 6 bpm), Accelerations: Reactive, and Decelerations: Late decels preceeding prolonged decel to 60's x3 lasting 30-60 seconds Uterine activityDate/time of onset: this AM 1000, Frequency: Every 2 minutes, Duration: 50-70 seconds, and Intensity: strong Dilation: 3.5 Effacement (%): 70 Station: -2 Exam by:: Nat Mule, RN   Prenatal labs: NURSING  PROVIDER  Office Location [facility] Dating by Early US  at [redacted]w[redacted]d  [facility] Model Traditional Anatomy U/S Persistent R Umb vein, Fetal scoliosis  Initiated care at  [employer]  English               LAB RESULTS   Support Person Husband  Genetics NIPS: LR AFP: neg      NT/IT (FT only)        Carrier Screen Horizon:   Rhogam  B/Positive/-- (05/21 1545) A1C/GTT Early HgbA1C:  Third trimester 2 hr GTT: normal   Flu Vaccine        TDaP Vaccine  Given 06-18-24 Blood Type B/Positive/-- (05/21 1545)  RSV Vaccine   Antibody Negative (05/21 1545)  COVID Vaccine   Rubella 3.27 (05/21 1545)  Feeding Plan both RPR Non Reactive (09/10 9166)  Contraception Depo-Provera  HBsAg Negative (05/21 1545)  Circumcision No HIV Non Reactive (09/10 0833)  Pediatrician  Tim and Elveria Ester HCVAb Non Reactive (05/21 1545)  Prenatal Classes        BTL Consent   Pap       Diagnosis  Date Value Ref Range Status  02/27/2024     Final    - Negative for intraepithelial lesion or malignancy (NILM)    BTL Pre-payment   GC/CT Initial:   36wks:    VBAC Consent   GBS For PCN allergy, check sensitivities   BRx Optimized? [ ]  yes   [X]  no      DME Rx [X]  BP cuff [ ]  Weight Scale Waterbirth  [ ]  Class [ ]  Consent [ ]  CNM visit  PHQ9 & GAD7 [X]  new OB [ x ] 28 weeks  [  ] 36 weeks Induction  [ ]  Orders Entered [ ] Foley Y/N    Prenatal Transfer Tool  Maternal Diabetes: No Genetic Screening: Normal Maternal Ultrasounds/Referrals: Other: persistent right umbilical vein and fetal scoliosis (T10-T11) Fetal  Ultrasounds or other Referrals:  None Maternal Substance Abuse:  Yes:  Type: Other: chewing tobacco Significant Maternal Medications:  None Significant Maternal Lab Results: Group B Strep positive Number of Prenatal Visits:greater than 3 verified prenatal visits Maternal Vaccinations:TDap Other Comments:  None   No results found  for this or any previous visit (from the past 24 hours).  Patient Active Problem List   Diagnosis Date Noted   Supervision of normal pregnancy in third trimester 08/31/2024   Group B Streptococcus carrier, +RV culture, currently pregnant 08/30/2024   Fetal cardiac anomaly affecting pregnancy, antepartum 05/30/2024   Chewing tobacco dependence 02/27/2024   Supervision of other normal pregnancy, antepartum 01/30/2024    Assessment/Plan:  Jacqueline Mitchell is a 28 y.o. [name] at [redacted]w[redacted]d here for SOL  #Labor:Continue giving labor break, will resume active management after 30-60 minutes of category 1 tracing. Will consider AROM at next cervical check #Pain: Epidural in place #FWB: Category 1 now, but episode of category 3 after epidural lasting ~6 minutes that resolved with Hands and knees, phenylephrine , Terbutaline , LR bolus, and Ephedrine . #GBS status:  Positive, on penicillin  #Feeding: Formula #Reproductive Life planning: debating between Progesterone only pills, Combination OCPs, and Depo Provera  #Circ:  no  Fairy Amy, MD  08/31/2024, 11:47 AM  Attestation of Supervision of Student:  I confirm that I have verified the information documented in the resident's note and that I have also personally reperformed the history, physical exam and all medical decision making activities.  I have verified that all services and findings are accurately documented in this student's note; and I agree with management and plan as outlined in the documentation. I have also made any necessary editorial changes.   Barkley LITTIE Angles, MD OB Fellow 08/31/2024 1:59 PM

## 2024-08-31 NOTE — MAU Note (Signed)
 MAU Labor Triage Note:  .Jacqueline Mitchell is a 28 y.o. at [redacted]w[redacted]d here in MAU reporting:  Contractions every: 2 minutes Onset of ctx: 1000 Pain Score: 7  Pain Location: Abdomen  ROM: none Vaginal Bleeding: bloody show Last SVE: 2cm on Tuesday Labor Pain Management Plan: Planning epidural  GBS: Positive  Fetal Movement: Reports decreased FM FHT: Fetal Heart Rate Mode: External Baseline Rate (A): 135 bpm  Vitals:   08/31/24 1113  BP: 112/70  Pulse: 91  Resp: 18  Temp: 98.4 F (36.9 C)  SpO2: 99%      Lab orders placed from triage: MAU Labor Eval OB Office: Faculty

## 2024-08-31 NOTE — Discharge Summary (Signed)
 Postpartum Discharge Summary  Date of Service updated***     Patient Name: Jacqueline Mitchell DOB: [birth year] MRN: [id number]  Date of admission: 08/31/2024 Delivery date:08/31/2024 Delivering provider: MAGALI BARKLEY CROME Date of discharge: 08/31/2024  Admitting diagnosis: Supervision of normal pregnancy in third trimester [Z34.93] Intrauterine pregnancy: [redacted]w[redacted]d     Secondary diagnosis:  Principal Problem:   SVD (spontaneous vaginal delivery)  Additional problems: none    Discharge diagnosis: Term Pregnancy Delivered and Anemia                                              Post partum procedures:{Postpartum procedures:23558} Augmentation: N/A Complications: None  Hospital course: Onset of Labor With Vaginal Delivery      28 y.o. yo [name] at [redacted]w[redacted]d was admitted in Latent Labor on 08/31/2024. Labor course was complicated by persistent late decels and prolonged decels.  Membrane Rupture Time/Date: unknown Delivery Method:Vaginal, Spontaneous Operative Delivery:N/A Episiotomy: None Lacerations:  None Patient had a postpartum course complicated by ***.  She is ambulating, tolerating a regular diet, passing flatus, and urinating well. Patient is discharged home in stable condition on 08/31/24.  Newborn Data: Birth date:08/31/2024 Birth time:2:31 PM Gender:Female Living status:Living Apgars: ,  Weight:3190 g  Magnesium Sulfate received: No BMZ received: No Rhophylac:No MMR:No T-DaP:Given prenatally Flu: No RSV Vaccine received: {RSV:31013} Transfusion:{Transfusion received:30440034}  Immunizations received: Immunization History  Administered Date(s) Administered   Influenza,inj,Quad PF,6+ Mos 12/26/2018, 08/13/2019, 07/06/2021   Tdap 03/20/2019, 09/14/2021, 06/18/2024    Physical exam  Vitals:   08/31/24 1330 08/31/24 1400 08/31/24 1430 08/31/24 1445  BP: 114/66 109/71 (!) 107/54 (!) 131/55  Pulse: (!) 106 (!) 105 (!) 145 (!) 117  Resp:      Temp:      TempSrc:       SpO2:      Weight:      Height:       General: {Exam; general:21111117} Lochia: {Desc; appropriate/inappropriate:30686::appropriate} Uterine Fundus: {Desc; firm/soft:30687} Incision: {Exam; incision:21111123} DVT Evaluation: {Exam; icu:7888877} Labs: Lab Results  Component Value Date   WBC 16.2 (H) 08/31/2024   HGB 8.7 (L) 08/31/2024   HCT 28.7 (L) 08/31/2024   MCV 78.2 (L) 08/31/2024   PLT 298 08/31/2024      Latest Ref Rng & Units 11/28/2021    6:16 PM  CMP  Glucose 70 - 99 mg/dL 861   BUN 6 - 20 mg/dL <5   Creatinine 9.55 - 1.00 mg/dL 9.36   Sodium 864 - 854 mmol/L 133   Potassium 3.5 - 5.1 mmol/L 3.0   Chloride 98 - 111 mmol/L 100   CO2 22 - 32 mmol/L 24   Calcium 8.9 - 10.3 mg/dL 8.3   Total Protein 6.5 - 8.1 g/dL 6.4   Total Bilirubin 0.3 - 1.2 mg/dL 0.3   Alkaline Phos 38 - 126 U/L 161   AST 15 - 41 U/L 19   ALT 0 - 44 U/L 7    Edinburgh Score:    02/22/2022   11:10 AM  Edinburgh Postnatal Depression Scale Screening Tool  I have been able to laugh and see the funny side of things. 0   I have looked forward with enjoyment to things. 0   I have blamed myself unnecessarily when things went wrong. 0   I have been anxious or worried for no good reason. 0  I have felt scared or panicky for no good reason. 0   Things have been getting on top of me. 0   I have been so unhappy that I have had difficulty sleeping. 0   I have felt sad or miserable. 0   I have been so unhappy that I have been crying. 0   The thought of harming myself has occurred to me. 0   Edinburgh Postnatal Depression Scale Total 0      Data saved with a previous flowsheet row definition   No data recorded  After visit meds:  Allergies as of 08/31/2024   No Known Allergies   Med Rec must be completed prior to using this [facility]***        Discharge home in stable condition Infant Feeding: {Baby feeding:23562} Infant Disposition:{CHL IP OB HOME WITH FNUYZM:76418} Discharge  instruction: per After Visit Summary and Postpartum booklet. Activity: Advance as tolerated. Pelvic rest for 6 weeks.  Diet: {OB ipzu:78888878} Future Appointments:No future appointments. Follow up Visit:   Please schedule this patient for a In person postpartum visit in 6 weeks with the following provider: Any provider. Additional Postpartum F/U:Postpartum Depression checkup  Low risk pregnancy complicated by: none Delivery mode:  Vaginal, Spontaneous Anticipated Birth Control:  {Birth Control:23956}   08/31/2024 Fairy Amy, MD

## 2024-08-31 NOTE — Anesthesia Procedure Notes (Signed)
 Epidural Patient location during procedure: OB Start time: 08/31/2024 12:06 PM End time: 08/31/2024 12:11 PM  Staffing Anesthesiologist: Peggye Delon Brunswick, MD Performed: anesthesiologist   Preanesthetic Checklist Completed: patient identified, IV checked, risks and benefits discussed, monitors and equipment checked, pre-op evaluation and timeout performed  Epidural Patient position: sitting Prep: DuraPrep and site prepped and draped Patient monitoring: continuous pulse ox and blood pressure Approach: midline Location: L3-L4 Injection technique: LOR saline  Needle:  Needle type: Tuohy  Needle gauge: 17 G Needle length: 9 cm and 9 Needle insertion depth: 4 cm Catheter type: closed end flexible Catheter size: 19 Gauge Catheter at skin depth: 8 cm Test dose: negative  Assessment Events: blood not aspirated, no cerebrospinal fluid, injection not painful, no injection resistance, no paresthesia and negative IV test  Additional Notes The patient has requested an epidural for labor pain management. Risks and benefits including, but not limited to, infection, bleeding, local anesthetic toxicity, headache, hypotension, back pain, block failure, etc. were discussed with the patient. The patient expressed understanding and consented to the procedure. I confirmed that the patient has no bleeding disorders and is not taking blood thinners. I confirmed the patient's last platelet count with the nurse. A time-out was performed immediately prior to the procedure. Please see nursing documentation for vital signs. Sterile technique was used throughout the whole procedure. Once LOR achieved, the epidural catheter threaded easily without resistance. Aspiration of the catheter was negative for blood and CSF. The epidural was dosed slowly and an infusion was started.  1 attempt(s)Reason for block:procedure for pain

## 2024-09-01 ENCOUNTER — Other Ambulatory Visit (HOSPITAL_COMMUNITY): Payer: Self-pay

## 2024-09-01 MED ORDER — BENZOCAINE-MENTHOL 20-0.5 % EX AERO: 1.0000 | INHALATION_SPRAY | CUTANEOUS | Status: AC | PRN

## 2024-09-01 MED ORDER — ACETAMINOPHEN 325 MG PO TABS
650.0000 mg | ORAL_TABLET | ORAL | 0 refills | Status: AC | PRN
  Filled 2024-09-01: qty 30, 3d supply, fill #0

## 2024-09-01 MED ORDER — WITCH HAZEL-GLYCERIN EX PADS: 1.0000 | MEDICATED_PAD | CUTANEOUS | Status: AC | PRN

## 2024-09-01 MED ORDER — COCONUT OIL OIL: 1.0000 | TOPICAL_OIL | Status: AC | PRN

## 2024-09-01 MED ORDER — MEDROXYPROGESTERONE ACETATE 150 MG/ML IM SUSP
150.0000 mg | Freq: Once | INTRAMUSCULAR | Status: AC
  Administered 2024-09-01: 150 mg via INTRAMUSCULAR
  Filled 2024-09-01: qty 1

## 2024-09-01 MED ORDER — IBUPROFEN 600 MG PO TABS
600.0000 mg | ORAL_TABLET | Freq: Four times a day (QID) | ORAL | 0 refills | Status: AC | PRN
  Filled 2024-09-01: qty 30, 8d supply, fill #0

## 2024-09-01 MED ORDER — DIBUCAINE (PERIANAL) 1 % EX OINT: 1.0000 | TOPICAL_OINTMENT | CUTANEOUS | Status: AC | PRN

## 2024-09-01 NOTE — Anesthesia Postprocedure Evaluation (Signed)
 Anesthesia Post Note  Patient: Jacqueline Mitchell  Procedure(s) Performed: AN AD HOC LABOR EPIDURAL     Patient location during evaluation: Mother Baby Anesthesia Type: Epidural Level of consciousness: awake and alert and oriented Pain management: satisfactory to patient Vital Signs Assessment: post-procedure vital signs reviewed and stable Respiratory status: respiratory function stable Cardiovascular status: stable Postop Assessment: no headache, no backache, epidural receding, patient able to bend at knees, no signs of nausea or vomiting, adequate PO intake and able to ambulate Anesthetic complications: no   No notable events documented.  Last Vitals:  Vitals:   09/01/24 0213 09/01/24 0611  BP: 99/68 (!) 103/59  Pulse: 99 81  Resp: 18 18  Temp: 37 C 36.9 C  SpO2: 100% 100%    Last Pain:  Vitals:   09/01/24 0830  TempSrc:   PainSc: 4    Pain Goal:                Epidural/Spinal Function Cutaneous sensation: Normal sensation (09/01/24 0830)  Shalise Rosado

## 2024-09-01 NOTE — Progress Notes (Signed)
 CSW received a consult due to MOB needing a carseat. CSW met MOB at bedside to offer support.  CSW entered the room, introduced herself and explained the reason for the visit. MOB presented as calm, was agreeable to consult and remained engaged throughout encounter.  CSW asked MOB if she needed a carseat and the $30 cost associated with participating in the carseat program. MOB reported yes and had the exact $30 cash present.  CSW delivered the carseat at bedside and collected the cost.  CSW identifies no further need for intervention and no barriers to discharge at this time.  Rosina Molt, [place] Clinical Social Worker [phone]

## 2024-09-01 NOTE — Patient Instructions (Signed)

## 2024-09-03 LAB — BIRTH TISSUE RECOVERY COLLECTION (PLACENTA DONATION)

## 2024-09-07 ENCOUNTER — Inpatient Hospital Stay (HOSPITAL_COMMUNITY)

## 2024-09-07 ENCOUNTER — Inpatient Hospital Stay (HOSPITAL_COMMUNITY): Admission: AD | Admit: 2024-09-07 | Source: Home / Self Care

## 2024-09-10 ENCOUNTER — Telehealth (HOSPITAL_COMMUNITY): Payer: Self-pay | Admitting: *Deleted

## 2024-09-10 NOTE — Telephone Encounter (Signed)
 Attempted hospital discharge follow-up phone call. Message received stating, Mailbox is full and cannot receive any messages at this time. Allean IVAR Carton, RN, 09/10/24, [phone]

## 2024-10-13 ENCOUNTER — Encounter: Payer: Self-pay | Admitting: Obstetrics and Gynecology

## 2024-10-13 ENCOUNTER — Ambulatory Visit (INDEPENDENT_AMBULATORY_CARE_PROVIDER_SITE_OTHER): Admitting: Obstetrics and Gynecology

## 2024-10-13 DIAGNOSIS — N3941 Urge incontinence: Secondary | ICD-10-CM

## 2024-10-13 DIAGNOSIS — N393 Stress incontinence (female) (male): Secondary | ICD-10-CM | POA: Diagnosis not present

## 2024-10-13 DIAGNOSIS — K5900 Constipation, unspecified: Secondary | ICD-10-CM

## 2024-10-13 MED ORDER — POLYETHYLENE GLYCOL 3350 17 GM/SCOOP PO POWD: 17.0000 g | Freq: Every day | ORAL | 1 refills | Status: AC | PRN

## 2024-10-13 MED ORDER — DOCUSATE SODIUM 100 MG PO CAPS: 100.0000 mg | ORAL_CAPSULE | Freq: Two times a day (BID) | ORAL | 0 refills | Status: AC

## 2024-10-13 NOTE — Progress Notes (Signed)
 "   Post Partum Visit Note  Jacqueline Mitchell is a 29 y.o. [phone] female who presents for a postpartum visit. She is 10 weeks postpartum following a normal spontaneous vaginal delivery.  I have fully reviewed the prenatal and intrapartum course. The delivery was at [redacted]W[redacted]D gestational weeks.  Anesthesia: epidural. Postpartum course has been good. Baby is doing well yeah. Baby is feeding by bottle - Similac Sensitive RS. Bleeding staining only. Bowel function is normal. Bladder function is normal. Patient is not sexually active. Contraception method is Depo-Provera  injections. Postpartum depression screening: negative.  Constipation and incontinence   The pregnancy intention screening data noted above was reviewed. Potential methods of contraception were discussed. The patient elected to proceed with depo provera     Health Maintenance Due  Topic Date Due   HPV VACCINES (1 - 3-dose series) Never done   Hepatitis B Vaccines 19-59 Average Risk (1 of 3 - 19+ 3-dose series) Never done   COVID-19 Vaccine (1 - 2025-26 season) Never done    The following portions of the patient's history were reviewed and updated as appropriate: allergies, current medications, past family history, past medical history, past social history, past surgical history, and problem list.  Review of Systems Pertinent items are noted in HPI.  Objective:  LMP  (LMP Unknown)    General:  alert and cooperative   Breasts:  not indicated  Lungs: Normal rate      Abdomen: nondistended   Wound N/a  GU exam:  not indicated       Assessment:   1. Postpartum exam (Primary) Repeat cbc today  - Ambulatory referral to Physical Therapy - CBC  2. Stress incontinence 3. Urge incontinence Encouraged pelvic PT - Ambulatory referral to Physical Therapy  4. Constipation, unspecified constipation type Increase hydration and fiber  - polyethylene glycol powder (GLYCOLAX /MIRALAX ) 17 GM/SCOOP powder; Take 17 g by mouth daily as needed.   Dispense: 510 g; Refill: 1 - docusate sodium  (COLACE) 100 MG capsule; Take 1 capsule (100 mg total) by mouth 2 (two) times daily.  Dispense: 10 capsule; Refill: 0   Plan:   Essential components of care per ACOG recommendations:  1.  Mood and well being: Patient with negative depression screening today. Reviewed local resources for support.  - Patient tobacco use? Yes, does not desire to quit   2. Infant care and feeding:  -Patient currently breastmilk feeding? No.  -Social determinants of health (SDOH) reviewed in EPIC. No concerns  3. Sexuality, contraception and birth spacing - Patient does not want a pregnancy in the next year.   - Reviewed reproductive life planning. Reviewed contraceptive methods based on pt preferences and effectiveness.  Patient desired Hormonal Injection today.   - Discussed birth spacing of 18 months  4. Sleep and fatigue -Encouraged family/partner/community support of 4 hrs of uninterrupted sleep to help with mood and fatigue  5. Physical Recovery  - Discussed patients delivery and complications. She describes her labor as mixed. - Patient had a vaginal delivery. Patient had a none laceration. Perineal healing reviewed. Patient expressed understanding - Patient has urinary incontinence? Yes. Discussed role of pelvic floor PT. - Patient is safe to resume physical and sexual activity  6.  Health Maintenance - HM due items addressed Yes - Last pap smear  Diagnosis  Date Value Ref Range Status  02/27/2024   Final   - Negative for intraepithelial lesion or malignancy (NILM)   Pap smear not done at today's visit.  -Breast Cancer screening  indicated? No.   7. Chronic Disease/Pregnancy Condition follow up: None  - PCP follow up   Nidia Daring, FNP  [facility], [facility]  "

## 2024-10-13 NOTE — Progress Notes (Signed)
 Pt BP in office today 92/59 and 89/65.  Pt reports lightheadedness/dizziness when bending over sometimes.

## 2024-10-14 ENCOUNTER — Ambulatory Visit: Payer: Self-pay | Admitting: Obstetrics and Gynecology

## 2024-10-14 LAB — CBC
Hematocrit: 36.2 % (ref 34.0–46.6)
Hemoglobin: 10.7 g/dL — ABNORMAL LOW (ref 11.1–15.9)
MCH: 23.2 pg — ABNORMAL LOW (ref 26.6–33.0)
MCHC: 29.6 g/dL — ABNORMAL LOW (ref 31.5–35.7)
MCV: 79 fL (ref 79–97)
Platelets: 355 x10E3/uL (ref 150–450)
RBC: 4.61 x10E6/uL (ref 3.77–5.28)
RDW: 17.1 % — ABNORMAL HIGH (ref 11.7–15.4)
WBC: 3.8 x10E3/uL (ref 3.4–10.8)

## 2024-11-17 ENCOUNTER — Ambulatory Visit: Payer: Self-pay

## 2025-01-01 ENCOUNTER — Encounter: Admitting: Physical Therapy

## 2025-01-08 ENCOUNTER — Encounter: Admitting: Physical Therapy

## 2025-01-15 ENCOUNTER — Encounter: Admitting: Physical Therapy

## 2025-01-22 ENCOUNTER — Encounter: Admitting: Physical Therapy
